# Patient Record
Sex: Female | Born: 1950 | Race: White | Hispanic: No | Marital: Married | Smoking: Never smoker
Health system: Southern US, Community
[De-identification: ages and names within clinical notes are randomized; demographics above are authoritative.]

## PROBLEM LIST (undated history)

## (undated) DIAGNOSIS — E039 Hypothyroidism, unspecified: Secondary | ICD-10-CM

## (undated) DIAGNOSIS — Z923 Personal history of irradiation: Secondary | ICD-10-CM

## (undated) DIAGNOSIS — M8580 Other specified disorders of bone density and structure, unspecified site: Secondary | ICD-10-CM

## (undated) DIAGNOSIS — M1990 Unspecified osteoarthritis, unspecified site: Secondary | ICD-10-CM

## (undated) DIAGNOSIS — E210 Primary hyperparathyroidism: Secondary | ICD-10-CM

## (undated) DIAGNOSIS — C50919 Malignant neoplasm of unspecified site of unspecified female breast: Secondary | ICD-10-CM

## (undated) DIAGNOSIS — C4360 Malignant melanoma of unspecified upper limb, including shoulder: Secondary | ICD-10-CM

## (undated) DIAGNOSIS — C50911 Malignant neoplasm of unspecified site of right female breast: Secondary | ICD-10-CM

## (undated) DIAGNOSIS — D126 Benign neoplasm of colon, unspecified: Secondary | ICD-10-CM

## (undated) HISTORY — DX: Hypothyroidism, unspecified: E03.9

## (undated) HISTORY — PX: ABDOMINAL HYSTERECTOMY: SHX81

## (undated) HISTORY — PX: TISSUE EXPANDER  REMOVAL W/ REPLACEMENT OF IMPLANT: SHX2529

## (undated) HISTORY — DX: Benign neoplasm of colon, unspecified: D12.6

## (undated) HISTORY — DX: Malignant neoplasm of unspecified site of unspecified female breast: C50.919

## (undated) HISTORY — PX: LAPAROSCOPIC TOTAL HYSTERECTOMY: SUR800

## (undated) HISTORY — DX: Malignant melanoma of unspecified upper limb, including shoulder: C43.60

## (undated) HISTORY — DX: Other specified disorders of bone density and structure, unspecified site: M85.80

## (undated) HISTORY — PX: BREAST LUMPECTOMY: SHX2

## (undated) HISTORY — DX: Malignant neoplasm of unspecified site of right female breast: C50.911

## (undated) HISTORY — PX: OTHER SURGICAL HISTORY: SHX169

## (undated) HISTORY — PX: COLONOSCOPY: SHX174

---

## 1998-01-17 ENCOUNTER — Other Ambulatory Visit: Admission: RE | Admit: 1998-01-17 | Discharge: 1998-01-17 | Payer: Self-pay | Admitting: Obstetrics & Gynecology

## 1999-01-09 ENCOUNTER — Other Ambulatory Visit: Admission: RE | Admit: 1999-01-09 | Discharge: 1999-01-09 | Payer: Self-pay | Admitting: Obstetrics & Gynecology

## 2000-01-27 ENCOUNTER — Ambulatory Visit (HOSPITAL_COMMUNITY): Admission: RE | Admit: 2000-01-27 | Discharge: 2000-01-27 | Payer: Self-pay | Admitting: *Deleted

## 2000-01-27 ENCOUNTER — Encounter: Payer: Self-pay | Admitting: *Deleted

## 2000-02-12 ENCOUNTER — Ambulatory Visit (HOSPITAL_COMMUNITY): Admission: RE | Admit: 2000-02-12 | Discharge: 2000-02-12 | Payer: Self-pay | Admitting: *Deleted

## 2000-02-12 ENCOUNTER — Encounter: Payer: Self-pay | Admitting: *Deleted

## 2000-02-15 ENCOUNTER — Other Ambulatory Visit: Admission: RE | Admit: 2000-02-15 | Discharge: 2000-02-15 | Payer: Self-pay | Admitting: Obstetrics & Gynecology

## 2001-02-17 ENCOUNTER — Encounter: Payer: Self-pay | Admitting: Family Medicine

## 2001-02-17 ENCOUNTER — Ambulatory Visit (HOSPITAL_COMMUNITY): Admission: RE | Admit: 2001-02-17 | Discharge: 2001-02-17 | Payer: Self-pay | Admitting: Family Medicine

## 2001-03-10 ENCOUNTER — Other Ambulatory Visit: Admission: RE | Admit: 2001-03-10 | Discharge: 2001-03-10 | Payer: Self-pay | Admitting: Obstetrics & Gynecology

## 2001-10-24 ENCOUNTER — Ambulatory Visit (HOSPITAL_COMMUNITY): Admission: RE | Admit: 2001-10-24 | Discharge: 2001-10-24 | Payer: Self-pay | Admitting: Family Medicine

## 2001-10-24 ENCOUNTER — Encounter: Payer: Self-pay | Admitting: Family Medicine

## 2002-02-16 ENCOUNTER — Encounter: Payer: Self-pay | Admitting: Family Medicine

## 2002-02-16 ENCOUNTER — Ambulatory Visit (HOSPITAL_COMMUNITY): Admission: RE | Admit: 2002-02-16 | Discharge: 2002-02-16 | Payer: Self-pay | Admitting: Family Medicine

## 2002-03-21 ENCOUNTER — Other Ambulatory Visit: Admission: RE | Admit: 2002-03-21 | Discharge: 2002-03-21 | Payer: Self-pay | Admitting: Obstetrics & Gynecology

## 2002-07-19 ENCOUNTER — Encounter (HOSPITAL_COMMUNITY): Admission: RE | Admit: 2002-07-19 | Discharge: 2002-08-18 | Payer: Self-pay | Admitting: Rheumatology

## 2003-03-15 ENCOUNTER — Other Ambulatory Visit: Admission: RE | Admit: 2003-03-15 | Discharge: 2003-03-15 | Payer: Self-pay | Admitting: Obstetrics & Gynecology

## 2003-07-26 ENCOUNTER — Ambulatory Visit (HOSPITAL_COMMUNITY): Admission: RE | Admit: 2003-07-26 | Discharge: 2003-07-26 | Payer: Self-pay | Admitting: Family Medicine

## 2004-04-10 ENCOUNTER — Other Ambulatory Visit: Admission: RE | Admit: 2004-04-10 | Discharge: 2004-04-10 | Payer: Self-pay | Admitting: Obstetrics & Gynecology

## 2005-04-23 ENCOUNTER — Other Ambulatory Visit: Admission: RE | Admit: 2005-04-23 | Discharge: 2005-04-23 | Payer: Self-pay | Admitting: Obstetrics & Gynecology

## 2006-03-11 ENCOUNTER — Ambulatory Visit: Payer: Self-pay | Admitting: Gastroenterology

## 2006-03-24 ENCOUNTER — Ambulatory Visit: Payer: Self-pay | Admitting: Gastroenterology

## 2006-03-24 ENCOUNTER — Encounter (INDEPENDENT_AMBULATORY_CARE_PROVIDER_SITE_OTHER): Payer: Self-pay | Admitting: *Deleted

## 2008-06-28 DIAGNOSIS — Z923 Personal history of irradiation: Secondary | ICD-10-CM

## 2008-06-28 DIAGNOSIS — C50919 Malignant neoplasm of unspecified site of unspecified female breast: Secondary | ICD-10-CM

## 2008-06-28 HISTORY — DX: Personal history of irradiation: Z92.3

## 2008-06-28 HISTORY — PX: BREAST BIOPSY: SHX20

## 2008-06-28 HISTORY — DX: Malignant neoplasm of unspecified site of unspecified female breast: C50.919

## 2008-11-13 ENCOUNTER — Encounter (INDEPENDENT_AMBULATORY_CARE_PROVIDER_SITE_OTHER): Payer: Self-pay | Admitting: Diagnostic Radiology

## 2008-11-13 ENCOUNTER — Encounter (INDEPENDENT_AMBULATORY_CARE_PROVIDER_SITE_OTHER): Payer: Self-pay | Admitting: Obstetrics & Gynecology

## 2008-11-13 ENCOUNTER — Encounter: Admission: RE | Admit: 2008-11-13 | Discharge: 2008-11-13 | Payer: Self-pay | Admitting: Obstetrics & Gynecology

## 2008-11-20 ENCOUNTER — Encounter: Admission: RE | Admit: 2008-11-20 | Discharge: 2008-11-20 | Payer: Self-pay | Admitting: Obstetrics & Gynecology

## 2008-11-20 ENCOUNTER — Ambulatory Visit: Payer: Self-pay | Admitting: Oncology

## 2008-11-26 ENCOUNTER — Ambulatory Visit: Admission: RE | Admit: 2008-11-26 | Discharge: 2008-12-25 | Payer: Self-pay | Admitting: Radiation Oncology

## 2008-12-10 ENCOUNTER — Encounter: Admission: RE | Admit: 2008-12-10 | Discharge: 2008-12-10 | Payer: Self-pay | Admitting: General Surgery

## 2008-12-10 ENCOUNTER — Ambulatory Visit (HOSPITAL_COMMUNITY): Admission: RE | Admit: 2008-12-10 | Discharge: 2008-12-10 | Payer: Self-pay | Admitting: General Surgery

## 2008-12-10 ENCOUNTER — Encounter (INDEPENDENT_AMBULATORY_CARE_PROVIDER_SITE_OTHER): Payer: Self-pay | Admitting: General Surgery

## 2008-12-26 ENCOUNTER — Ambulatory Visit (HOSPITAL_COMMUNITY): Admission: RE | Admit: 2008-12-26 | Discharge: 2008-12-26 | Payer: Self-pay | Admitting: General Surgery

## 2008-12-26 ENCOUNTER — Encounter (INDEPENDENT_AMBULATORY_CARE_PROVIDER_SITE_OTHER): Payer: Self-pay | Admitting: General Surgery

## 2008-12-26 ENCOUNTER — Ambulatory Visit: Admission: RE | Admit: 2008-12-26 | Discharge: 2009-03-21 | Payer: Self-pay | Admitting: Radiation Oncology

## 2008-12-27 ENCOUNTER — Ambulatory Visit (HOSPITAL_COMMUNITY): Payer: Self-pay | Admitting: Oncology

## 2009-02-06 ENCOUNTER — Ambulatory Visit (HOSPITAL_COMMUNITY): Admission: RE | Admit: 2009-02-06 | Discharge: 2009-02-06 | Payer: Self-pay | Admitting: Oncology

## 2009-06-17 ENCOUNTER — Encounter: Admission: RE | Admit: 2009-06-17 | Discharge: 2009-06-17 | Payer: Self-pay | Admitting: Radiation Oncology

## 2009-07-18 ENCOUNTER — Ambulatory Visit (HOSPITAL_COMMUNITY): Payer: Self-pay | Admitting: Oncology

## 2009-08-08 ENCOUNTER — Ambulatory Visit (HOSPITAL_BASED_OUTPATIENT_CLINIC_OR_DEPARTMENT_OTHER): Admission: RE | Admit: 2009-08-08 | Discharge: 2009-08-08 | Payer: Self-pay | Admitting: Orthopedic Surgery

## 2009-11-04 ENCOUNTER — Ambulatory Visit (HOSPITAL_BASED_OUTPATIENT_CLINIC_OR_DEPARTMENT_OTHER): Admission: RE | Admit: 2009-11-04 | Discharge: 2009-11-04 | Payer: Self-pay | Admitting: Orthopedic Surgery

## 2009-11-21 ENCOUNTER — Encounter: Admission: RE | Admit: 2009-11-21 | Discharge: 2009-11-21 | Payer: Self-pay | Admitting: Obstetrics & Gynecology

## 2010-02-13 ENCOUNTER — Ambulatory Visit (HOSPITAL_COMMUNITY): Payer: Self-pay | Admitting: Oncology

## 2010-08-14 ENCOUNTER — Other Ambulatory Visit (HOSPITAL_COMMUNITY): Payer: Self-pay | Admitting: Oncology

## 2010-08-14 ENCOUNTER — Ambulatory Visit (HOSPITAL_COMMUNITY): Payer: BC Managed Care – PPO

## 2010-08-14 DIAGNOSIS — C50919 Malignant neoplasm of unspecified site of unspecified female breast: Secondary | ICD-10-CM

## 2010-09-03 ENCOUNTER — Other Ambulatory Visit: Payer: Self-pay | Admitting: Obstetrics & Gynecology

## 2010-09-03 DIAGNOSIS — Z9889 Other specified postprocedural states: Secondary | ICD-10-CM

## 2010-09-15 LAB — POCT HEMOGLOBIN-HEMACUE: Hemoglobin: 13.9 g/dL (ref 12.0–15.0)

## 2010-10-05 LAB — COMPREHENSIVE METABOLIC PANEL
AST: 31 U/L (ref 0–37)
Albumin: 4.3 g/dL (ref 3.5–5.2)
Alkaline Phosphatase: 63 U/L (ref 39–117)
CO2: 27 mEq/L (ref 19–32)
Chloride: 106 mEq/L (ref 96–112)
Creatinine, Ser: 0.64 mg/dL (ref 0.4–1.2)
GFR calc Af Amer: >60 mL/min (ref >60)
GFR calc non Af Amer: >60 mL/min (ref >60)
Potassium: 4.5 mEq/L (ref 3.5–5.1)
Total Bilirubin: 0.5 mg/dL (ref 0.3–1.2)

## 2010-10-05 LAB — DIFFERENTIAL
Basophils Absolute: 0 10*3/uL (ref 0.0–0.1)
Basophils Relative: 1 % (ref 0–1)
Eosinophils Absolute: 0.2 10*3/uL (ref 0.0–0.7)
Eosinophils Relative: 5 % (ref 0–5)
Lymphocytes Relative: 33 % (ref 12–46)
Monocytes Absolute: 0.4 10*3/uL (ref 0.1–1.0)

## 2010-10-05 LAB — CBC
HCT: 41.3 % (ref 36.0–46.0)
MCHC: 35.2 g/dL (ref 30.0–36.0)
MCV: 87.7 fL (ref 78.0–100.0)
Platelets: 297 10*3/uL (ref 150–400)
RBC: 4.45 MIL/uL (ref 3.87–5.11)
RBC: 4.71 MIL/uL (ref 3.87–5.11)
WBC: 6.1 10*3/uL (ref 4.0–10.5)
WBC: 4.9 10*3/uL (ref 4.0–10.5)

## 2010-11-10 NOTE — Op Note (Signed)
Kelsey Andrews, Kelsey Andrews               ACCOUNT NO.:  [id number]   MEDICAL RECORD NO.:  [id number]          PATIENT TYPE:  AMB   LOCATION:  SDS                          FACILITY:  [facility]   PHYSICIAN:  Ollen Gross. Vernell Morgans, M.D. DATE OF BIRTH:  1951-01-20   DATE OF PROCEDURE:  12/10/2008  DATE OF DISCHARGE:  12/10/2008                               OPERATIVE REPORT   PREOPERATIVE DIAGNOSIS:  Right breast cancer.   POSTOPERATIVE DIAGNOSIS:  Right breast cancer.   PROCEDURE:  Right breast needle-localized lumpectomy and sentinel node  biopsy with injection of blue dye.   SURGEON:  Ollen Gross. Vernell Morgans, MD   ANESTHESIA:  General LMA.   PROCEDURE:  After informed consent was obtained, the patient was brought  to the operating room, placed in the supine position on the operating  table.  After adequate induction of general anesthesia, the patient's  right breast, chest and axilla were all prepped with Betadine and draped  in usual sterile manner.  Earlier in the day, the patient had undergone  injection of 1 mCi of technetium sulfur colloid in the subareolar  position.  The patient also undergone placement of a needle localizing  wire.  In the right breast, the wire was entering the breast inferiorly.  The wound about the 7-8 o'clock position.  At this point, 2 mL of  methylene blue and 3 mL of injectable saline were also injected in the  subareolar position and the breast was massaged for several minutes.  Next, a NeoProbe was used to identify a hot spot in the right axilla.  A  small transverse incision was made with 15-blade knife overlying this  hot spot.  This incision was carried down through the skin and  subcutaneous tissue sharply with electrocautery until the axilla was  entered using the NeoProbe to direct the dissection of blunt.  Hemostat  dissection was carried out until a hot lymph node was identified.  I  could not identify any blue dye.  The lymph node was dissected free by  combination of sharp Bovie dissection and then the lymphatics were  clamped with hemostats, divided and ligated with 3-0 Vicryl ties.  Ex  vivo counts on this lymph node were approximately 3000.  Second hot spot  was identified, which I believe was probably just a second portion of  the regional node and this was also removed in a similar fashion.  No  other hot spot or blue dye were identified.  There were no other  palpable lymph nodes.  Touch preps were sent for the sentinel node,  which were negative.  At this point, area was hemostatic.  The deep  layer of the wound was closed with interrupted 3-0 Vicryl stitches and  the skin was closed with a running 4-0 Monocryl subcuticular stitch.  Next, attention was turned to the right breast.  A radial-type incision  was made approximately the 7 o'clock position of the right breast  overlying the path of the wire.  This incision was made with 15-blade  knife.  This incision was carried through the skin and  subcutaneous  tissue sharply with electrocautery.  Once into the breast tissue, the  path of the wire could be palpated.  A circular portion of breast tissue  was excised sharply around the path of the wire.  This was all done  sharply with electrocautery.  Once this was removed, a specimen  radiograph was obtained that showed the clip to be in the center of the  specimen.  It was then sent to pathology for further evaluation.  Hemostasis was achieved using Bovie electrocautery.  Wound was irrigated  with copious amounts of saline.  The wound was infiltrated with 0.25%  Marcaine.  Some subcutaneous skin flaps were created circumferentially  around the wound.  This allowed [place] to close some of the breast tissue  over the cavity with interrupted 3-0 Vicryl stitches and then we closed  the skin with a running 4-0 Monocryl subcuticular stitch.  Dermabond dressings were applied.  The patient tolerated the procedure  well.  At the end of the case,  all needle, sponge, and instrument counts  were correct.  The patient was then awakened and taken to recovery room  in stable condition.      Ollen Gross. Vernell Morgans, M.D.  Electronically Signed     PST/MEDQ  D:  12/13/2008  T:  12/14/2008  Job:  [id number]

## 2010-11-10 NOTE — Op Note (Signed)
Kelsey Andrews, MCCORMICK               ACCOUNT NO.:  [id number]   MEDICAL RECORD NO.:  [id number]          PATIENT TYPE:  REC   LOCATION:  RDNC                         FACILITY:  [facility]   PHYSICIAN:  Ollen Gross. Vernell Morgans, M.D. DATE OF BIRTH:  06/02/1951   DATE OF PROCEDURE:  12/26/2008  DATE OF DISCHARGE:                               OPERATIVE REPORT   PREOPERATIVE DIAGNOSIS:  Right breast cancer.   POSTOPERATIVE DIAGNOSIS:  Right breast cancer with positive posterior  margin.   PROCEDURE:  Reexcision of right lumpectomy, posterior margin.   SURGEON:  Ollen Gross. Vernell Morgans, MD   ANESTHESIA:  General.   PROCEDURE IN DETAIL:  After informed consent was obtained, the patient  was brought to the operating room and placed in the supine position on  the operating table.  After adequate induction of general anesthesia,  the patient's right breast was prepped with Betadine and draped in the  usual sterile manner.  The old incision was opened with a #15 blade  knife.  The seroma cavity was evacuated.  The deep portion of the cavity  was then reexcised down to the muscular wall.  This was all done sharply  with the electrocautery.  Once this was removed, it was oriented to with  the short double stitch superior and long double stitch lateral and a  single stitch on the new deep true surgical margin.  This was sent to  Pathology for further evaluation.  Hemostasis was achieved using the  Bovie electrocautery.  The deep layer of the wound was then reclosed  with interrupted 3-0 Vicryl stitches and the skin was closed with  running 4-0 Monocryl subcuticular stitch.  Dermabond dressing was  applied. The patient tolerated the procedure.  At the end of the case,  all needle, sponge, and instrument counts were correct.  The patient was  then awakened, and taken to the recovery room in stable condition.      Ollen Gross. Vernell Morgans, M.D.  Electronically Signed     PST/MEDQ  D:  12/26/2008  T:  12/27/2008   Job:  [id number]

## 2010-11-13 NOTE — Consult Note (Signed)
NAME:  Kelsey Andrews, Kelsey Andrews NO.:  [id number]   MEDICAL RECORD NO.:  [id number]                   PATIENT TYPE:   LOCATION:                                       FACILITY:   PHYSICIAN:  Aundra Dubin, M.D.            DATE OF BIRTH:   DATE OF CONSULTATION:  07/19/2002  DATE OF DISCHARGE:                                   CONSULTATION   CHIEF COMPLAINT:  Numb hands, swollen joints.   Dear Annette Stable:   Thank you for this consultation.  The patient is a 60 year old white female  who has had over the last six months several episodes of individual joints  swelling.  They would be quite painful.  At one point, the left wrist was  swollen, later the left thumb.  At one point, the top of her left foot was  swollen and was huge.  This took about two weeks to resolve.  She says she  could hardly walk on it.  Each of these joint areas swelled at separate  times and would resolve before another joint would become involved.  She has  not had any swelling for about 2-1/2 months.   Another problem is waking up with numbness in her hands.  She might have  numbness at other times but usually it has been worse in the mornings.  This  has not occurred for the last week.  When she would wake up, her hands would  be numb.  She would move them about and in 5-10 minutes this would be  resolved.  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  She has also had a  tendonitis or bursitis injected to the right shoulder about one year ago.  This is not bothering her.   At the present time, she is feeling well and is not hurting anywhere.  She  denies any fever or significant rashes.  Her weight is increased over a  number of months and she has recently lost 6 pounds by cutting back on  kilocalories.  Her energy level is described as being low.  She is back on  her hormone therapy and is sleeping well.  There have been no headaches,  diarrhea, constipation, blood or mucus, chest pain,  or shortness of breath.   PAST MEDICAL/SURGICAL HISTORY:  Hypothyroidism, tonsillectomy, hysterectomy.   MEDICATIONS:  1. Lexapro 10 mg daily.  2. Levoxyl 0.75 mg daily.  3. Vivelle 0.1 mg patch.   DRUG INTOLERANCES:  1. PENICILLIN.  2. NARCOTICS.   FAMILY HISTORY:  Her father is 69 years of age and has diabetes and  significant peripheral neuropathy.  Her mother is 45 years of age and has  some mild arthritis and back pain.  She also has HTN.   SOCIAL HISTORY:  She has two children and is married.  She works in a Publishing rights manager.  She does not smoke or drink alcohol.  PHYSICAL EXAMINATION:  VITAL SIGNS:  Weight 186 pounds, blood pressure  100/64, respirations 16, pulse 60.  GENERAL:  She is slightly overweight and is in no distress.  SKIN:  Clear.  HEENT:  PERRL/EOMI.  Mouth clear.  NECK:  Fullness of the thyroid, nontender.  LUNGS:  Clear.  HEART:  Regular.  No murmur.  ABDOMEN:  Negative HSM, nontender.  MUSCULOSKELETAL:  The hands showed no degenerative nodularity throughout.  Wrists, elbows, and shoulders have a good range of motion and the right  shoulder is mildly stiff.  Trigger points at the shoulders, neck, occiput,  anterior chest, and upper paraspinous muscles are nontender.  Hips, knees,  ankles, and feet have a good range of motion and are all cool and nonswollen  and nontender.  NEUROLOGIC:  Nonfocal.   ASSESSMENT AND PLAN:  1. Episodic joint swelling.  No full diagnosis could be made at this time.     She would be quite young to have regular gout but this is certainly in     the differential.  The swollen joints, however, were not red and warm.     Another possibility is she could have gout-like episodes from basic     hydroxy calcium shedding into joints; however, usually these types of     presentations the patient has decided osteoarthritis.  She does not have     this.  We will have to follow her course to see if it returns.  I will     want to see her  if she has swollen joints.  2. Hand numbness.  I believe this is positional and is related to lack of     blood flow into the joints causing the numbness and tingling.  This     resolves with movement.  3. History of right shoulder bursitis.  4. Hypothyroidism.   Bill, at the present time the patient is pain free and doing well.  I have  greatly encouraged her to return for further evaluation if the swelling and  pain returns to any of the joints.  She will return on a p.r.n. basis.  Thank you.                                               Aundra Dubin, M.D.    WWT/MEDQ  D:  07/19/2002  T:  07/20/2002  Job:  [id number]   cc:   Kirk Ruths, M.D.  [address]  [place]  [place] [address]  Fax: [phone]

## 2010-11-24 ENCOUNTER — Ambulatory Visit
Admission: RE | Admit: 2010-11-24 | Discharge: 2010-11-24 | Disposition: A | Discharge status: Home / Self Care | Payer: BC Managed Care – PPO | Source: Ambulatory Visit | Attending: Obstetrics & Gynecology | Admitting: Obstetrics & Gynecology

## 2010-11-24 DIAGNOSIS — Z9889 Other specified postprocedural states: Secondary | ICD-10-CM

## 2010-12-22 ENCOUNTER — Other Ambulatory Visit (INDEPENDENT_AMBULATORY_CARE_PROVIDER_SITE_OTHER): Payer: Self-pay | Admitting: General Surgery

## 2010-12-22 DIAGNOSIS — Z9889 Other specified postprocedural states: Secondary | ICD-10-CM

## 2011-01-01 ENCOUNTER — Ambulatory Visit
Admission: RE | Admit: 2011-01-01 | Discharge: 2011-01-01 | Disposition: A | Discharge status: Home / Self Care | Payer: BC Managed Care – PPO | Source: Ambulatory Visit | Attending: General Surgery | Admitting: General Surgery

## 2011-01-01 DIAGNOSIS — Z9889 Other specified postprocedural states: Secondary | ICD-10-CM

## 2011-01-01 MED ORDER — GADOBENATE DIMEGLUMINE 529 MG/ML IV SOLN
18.0000 mL | Freq: Once | INTRAVENOUS | Status: AC | PRN
  Administered 2011-01-01: 18 mL via INTRAVENOUS

## 2011-01-07 ENCOUNTER — Telehealth (INDEPENDENT_AMBULATORY_CARE_PROVIDER_SITE_OTHER): Payer: Self-pay | Admitting: General Surgery

## 2011-01-08 ENCOUNTER — Telehealth (INDEPENDENT_AMBULATORY_CARE_PROVIDER_SITE_OTHER): Payer: Self-pay | Admitting: General Surgery

## 2011-01-08 NOTE — Telephone Encounter (Signed)
Reviewed mri results

## 2011-02-05 ENCOUNTER — Ambulatory Visit (HOSPITAL_COMMUNITY)
Admission: RE | Admit: 2011-02-05 | Discharge: 2011-02-05 | Disposition: A | Discharge status: Home / Self Care | Payer: BC Managed Care – PPO | Source: Ambulatory Visit | Attending: Oncology | Admitting: Oncology

## 2011-02-05 DIAGNOSIS — Z780 Asymptomatic menopausal state: Secondary | ICD-10-CM | POA: Insufficient documentation

## 2011-02-05 DIAGNOSIS — M899 Disorder of bone, unspecified: Secondary | ICD-10-CM | POA: Insufficient documentation

## 2011-02-05 DIAGNOSIS — C50919 Malignant neoplasm of unspecified site of unspecified female breast: Secondary | ICD-10-CM

## 2011-02-11 ENCOUNTER — Encounter: Payer: Self-pay | Admitting: Internal Medicine

## 2011-02-12 ENCOUNTER — Encounter (HOSPITAL_COMMUNITY): Payer: BC Managed Care – PPO | Attending: Oncology | Admitting: Oncology

## 2011-02-12 ENCOUNTER — Encounter (HOSPITAL_COMMUNITY): Payer: Self-pay | Admitting: Oncology

## 2011-02-12 DIAGNOSIS — Z79899 Other long term (current) drug therapy: Secondary | ICD-10-CM | POA: Insufficient documentation

## 2011-02-12 DIAGNOSIS — M899 Disorder of bone, unspecified: Secondary | ICD-10-CM

## 2011-02-12 DIAGNOSIS — K117 Disturbances of salivary secretion: Secondary | ICD-10-CM

## 2011-02-12 DIAGNOSIS — M8580 Other specified disorders of bone density and structure, unspecified site: Secondary | ICD-10-CM

## 2011-02-12 DIAGNOSIS — M949 Disorder of cartilage, unspecified: Secondary | ICD-10-CM

## 2011-02-12 DIAGNOSIS — N390 Urinary tract infection, site not specified: Secondary | ICD-10-CM

## 2011-02-12 DIAGNOSIS — C50919 Malignant neoplasm of unspecified site of unspecified female breast: Secondary | ICD-10-CM

## 2011-02-12 LAB — COMPREHENSIVE METABOLIC PANEL
ALT: 30 U/L (ref 0–35)
CO2: 26 mEq/L (ref 19–32)
Calcium: 10.6 mg/dL — ABNORMAL HIGH (ref 8.4–10.5)
Chloride: 104 mEq/L (ref 96–112)
Creatinine, Ser: 0.6 mg/dL (ref 0.50–1.10)
GFR calc Af Amer: >60 mL/min (ref >60)
GFR calc non Af Amer: >60 mL/min (ref >60)
Glucose, Bld: 84 mg/dL (ref 70–99)
Total Bilirubin: 0.3 mg/dL (ref 0.3–1.2)

## 2011-02-12 LAB — URINALYSIS, ROUTINE W REFLEX MICROSCOPIC
Bilirubin Urine: NEGATIVE
Hgb urine dipstick: NEGATIVE
Nitrite: NEGATIVE
Protein, ur: NEGATIVE mg/dL
Specific Gravity, Urine: <1.005 — ABNORMAL LOW (ref 1.005–1.030)
Urobilinogen, UA: 0.2 mg/dL (ref 0.0–1.0)

## 2011-02-12 LAB — URINE MICROSCOPIC-ADD ON

## 2011-02-12 LAB — CBC
Hemoglobin: 14.2 g/dL (ref 12.0–15.0)
MCH: 29.9 pg (ref 26.0–34.0)
MCV: 87.2 fL (ref 78.0–100.0)
RBC: 4.75 MIL/uL (ref 3.87–5.11)
WBC: 5.7 10*3/uL (ref 4.0–10.5)

## 2011-02-12 NOTE — Patient Instructions (Signed)
[facility]  Discharge Instructions  RECOMMENDATIONS MADE BY THE CONSULTANT AND ANY TEST RESULTS WILL BE SENT TO YOUR REFERRING DOCTOR.   EXAM FINDINGS BY MD TODAY AND SIGNS AND SYMPTOMS TO REPORT TO CLINIC OR PRIMARY MD:  Exam good today.  INSTRUCTIONS GIVEN AND DISCUSSED: Other  : You may need to se your rheumatologist again  SPECIAL INSTRUCTIONS/FOLLOW-UP: Other (Referral/Appointments)  Continue calcium daily and repeat bone density in two years Reeturn to [place] in 6 months   I acknowledge that I have been informed and understand all the instructions given to me and received a copy. I do not have any more questions at this time, but understand that I may call the Specialty Clinic at [facility] at [phone] during business hours should I have any further questions or need assistance in obtaining follow-up care.    __________________________________________  _____________  __________ Signature of Patient or Authorized Representative            Date                   Time    __________________________________________ Nurse's Signature

## 2011-02-12 NOTE — Progress Notes (Signed)
This office note has been dictated.

## 2011-02-13 NOTE — Progress Notes (Signed)
Dr. Chevis Pretty is her doctor, Dr. [name] and Lurline Hare of radiation therapy department, [facility].  DIAGNOSES: 1. 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  She had re-excision on     12/25/2008.  No further invasive disease was found.  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  It was 100% ER, PR-     positive.  Her invasive disease was ER positive 100%, PR negative     however.  Her Ki-67 marker was 15%.  HER-2/neu was negative.     Oncotype score showed her to be ER positive, weakly PR positive,     HER-2-negative, and a recurrence score of 21, indicating recurrence     rate around 13% in 10 years after therapy with tamoxifen.  We     elected, therefore, to give her radiation therapy and antiestrogen     therapy in the form of Arimidex, which she is tolerating well. 2. Dry eye syndrome, dry mouth.  Rule out Sjogren's. 3. Bilateral carpal tunnel surgery. 4. Mild obesity with reflux symptomatology in the past. 5. 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  She has started noticing the last 6 months or so more dry mouth than she has ever had before.  She has dry eye syndrome and stated that she saw Dr. Jimmy Footman in [place] years ago for possible Sjogren's, but it was ruled out.  This, however, is worsening, namely the dry mouth syndrome and trouble swallowing.  She cannot swallow food without a gulp of water, it sounds like, with every bite.  She does not have other symptoms suggestive of Sjogren's that I can appreciate at this time.  From the standpoint of breast cancer, she is not aware of any lumps over the breast.  The scar she states is getting a little thicker but it is slightly tender at best.  She has kept up with her mammography and MRI as of 01/01/2011.  The mammography was on  11/04/2010.  The MRI showed no evidence for abnormalities other than post-lumpectomy changes.  Her labs were done on 17th of this month, namely today, and calcium is minimally elevated at 10.6, interestingly.  Alkaline phosphatase 127. CBC:  Platelets are unremarkable.  Sugar is 84.  Urinalysis showed 7-10 white cells but otherwise negative.  She has a small number of leukocytes, which led to the microscopic showed 7-10 white cells.  She has had, she states, 2, possibly 3 urinary tract infections since starting the Arimidex but, again, there is no evidence of one at this time.  I am wondering whether her estrogen deficiency at this time because of the Arimidex is leading to some urinary symptomatology.  PHYSICAL EXAMINATION:  Vital signs today:  She is still overweight for her height.  That is not new or different.  Her weight is 195 pounds today, up from 193 in February, up from 187 when I first met her.  Her blood pressure is 118/82, left arm, sitting position.  Pulse right around 120 initially.  When I saw her, it was down to 92 and regular. She is afebrile.  General:  Warm and dry to the touch.  She is in no acute distress.  Lymphatic:  She has no adenopathy.  Breasts:  The right breast is slightly smaller and has surgical changes,  slightly thickened but without mass.  The scar, I think, is very normal.  The left breast is negative for masses.  Heart: A regular rhythm and rate without murmur, rub or gallop.  Lungs:  Clear to auscultation and percussion. There is no thyromegaly.  She has no lymphadenopathy in the cervical, supraclavicular, infraclavicular, axillary or inguinal areas.  Abdomen: Soft, obese, nontender, without organomegaly or masses.  HEENT:  Mouth looks a very dry, however, on exam.  I do think she needs to be reevaluated possibly since this dry mouth is getting much, much worse, and I have recommended that she call Dr. Jeanine Luz office to see if he is still  practicing or whether his counterpart can see her.  In the meantime I think she needs to continue the Arimidex and I think she does need to take her calcium on a daily basis.  She misses about half of her month's doses of calcium.  She is only taking 1 pill a day.  Her recent bone density report did show changes compared to 2010.  She is taking her vitamin D religiously at 2000 units a day.  We will see her back in 6 months.She is to let me know sooner if she needs [place], but I did ask her to talk to Dr. [name] if she gets worse.  She is due to see him in December but if she needs to, I have encouraged her to see him sooner but I am happy to talk to him if she needs me to.    ______________________________ Ladona Horns. Mariel Sleet, MD ESN/MEDQ  D:  02/12/2011  T:  02/13/2011  Job:  [id number]

## 2011-02-14 LAB — URINE CULTURE: Colony Count: 7000

## 2011-02-15 ENCOUNTER — Telehealth (HOSPITAL_COMMUNITY): Payer: Self-pay | Admitting: *Deleted

## 2011-02-15 LAB — URINE CULTURE: Culture  Setup Time: 201208172010

## 2011-02-15 NOTE — Telephone Encounter (Signed)
Spoke with pt. Bactrim DS called into [place]. Pt request that Dr.Neijstrom recommend a urologist in [place]. She thinks that her husband see's Dr.Peterson in [place].

## 2011-02-15 NOTE — Telephone Encounter (Signed)
Message copied by Dennie Maizes on Mon Feb 15, 2011  4:23 PM ------      Message from: Mariel Sleet, ERIC S      Created: Mon Feb 15, 2011  3:30 PM       Call in Bactrim ds Bid for 8 days and schedule Urology consult for repeated UTIs since being on Arimidex and tell her I talked to Dr [name]

## 2011-02-16 NOTE — Telephone Encounter (Signed)
I spoke with Kelsey Andrews and she is ok with seeing [facility] here instead of [place]-so make app't and call her mobile.  I just spoke with her.

## 2011-03-02 ENCOUNTER — Ambulatory Visit (INDEPENDENT_AMBULATORY_CARE_PROVIDER_SITE_OTHER): Payer: BC Managed Care – PPO | Admitting: Urology

## 2011-03-02 DIAGNOSIS — N3020 Other chronic cystitis without hematuria: Secondary | ICD-10-CM

## 2011-03-02 DIAGNOSIS — N952 Postmenopausal atrophic vaginitis: Secondary | ICD-10-CM

## 2011-03-05 ENCOUNTER — Encounter: Payer: Self-pay | Admitting: Gastroenterology

## 2011-03-05 ENCOUNTER — Ambulatory Visit (AMBULATORY_SURGERY_CENTER): Payer: BC Managed Care – PPO | Admitting: *Deleted

## 2011-03-05 VITALS — Ht 65.5 in | Wt 197.0 lb

## 2011-03-05 DIAGNOSIS — Z1211 Encounter for screening for malignant neoplasm of colon: Secondary | ICD-10-CM

## 2011-03-05 MED ORDER — SUPREP BOWEL PREP KIT 17.5-3.13-1.6 GM/177ML PO SOLN: 1.0000 | ORAL | Status: DC

## 2011-03-26 ENCOUNTER — Ambulatory Visit (AMBULATORY_SURGERY_CENTER): Payer: BC Managed Care – PPO | Admitting: Internal Medicine

## 2011-03-26 ENCOUNTER — Encounter: Payer: Self-pay | Admitting: Internal Medicine

## 2011-03-26 VITALS — Temp 98.9°F | Ht 65.0 in | Wt 197.0 lb

## 2011-03-26 DIAGNOSIS — D126 Benign neoplasm of colon, unspecified: Secondary | ICD-10-CM

## 2011-03-26 DIAGNOSIS — Z1211 Encounter for screening for malignant neoplasm of colon: Secondary | ICD-10-CM

## 2011-03-26 MED ORDER — SODIUM CHLORIDE 0.9 % IV SOLN: 500.0000 mL | INTRAVENOUS | Status: DC

## 2011-03-26 NOTE — Patient Instructions (Signed)
Handouts given on polyps, diverticulosis, high fiber diet  Discharge instructions given per blue and green sheets  Repeat colonoscopy in 7 years- 2019- we will mail you a letter to remind you of this  You will receive a letter from [place] in 1-2 weeks with the pathology results and Dr Regino Schultze recommendations

## 2011-03-29 ENCOUNTER — Telehealth: Payer: Self-pay

## 2011-03-29 NOTE — Telephone Encounter (Signed)
No ID on answering machine. 

## 2011-03-30 ENCOUNTER — Encounter: Payer: Self-pay | Admitting: Internal Medicine

## 2011-06-08 ENCOUNTER — Ambulatory Visit (INDEPENDENT_AMBULATORY_CARE_PROVIDER_SITE_OTHER): Payer: BC Managed Care – PPO | Admitting: Urology

## 2011-06-08 DIAGNOSIS — N3020 Other chronic cystitis without hematuria: Secondary | ICD-10-CM

## 2011-06-08 DIAGNOSIS — N952 Postmenopausal atrophic vaginitis: Secondary | ICD-10-CM

## 2011-08-13 ENCOUNTER — Encounter (HOSPITAL_COMMUNITY): Payer: BC Managed Care – PPO | Attending: Oncology | Admitting: Oncology

## 2011-08-13 VITALS — BP 132/79 | HR 70 | Temp 98.0°F | Wt 195.7 lb

## 2011-08-13 DIAGNOSIS — M899 Disorder of bone, unspecified: Secondary | ICD-10-CM

## 2011-08-13 DIAGNOSIS — M949 Disorder of cartilage, unspecified: Secondary | ICD-10-CM

## 2011-08-13 DIAGNOSIS — D0590 Unspecified type of carcinoma in situ of unspecified breast: Secondary | ICD-10-CM

## 2011-08-13 DIAGNOSIS — H04129 Dry eye syndrome of unspecified lacrimal gland: Secondary | ICD-10-CM

## 2011-08-13 DIAGNOSIS — C50911 Malignant neoplasm of unspecified site of right female breast: Secondary | ICD-10-CM

## 2011-08-13 NOTE — Progress Notes (Signed)
CC:   Kelsey Andrews. Kelsey Sills, MD Kelsey Millard. Andrews, M.D. Kelsey Andrews. Kelsey Andrews, M.D. Kelsey Andrews, M.D.  DIAGNOSES: 1. Stage I (T1b N0) intermediate grade invasive ductal carcinoma of     the right breast with biopsy on 11/13/2008, re-excision on     12/25/2008 with no further invasive disease found.  She did have     some associated ductal carcinoma in situ close to the posterior     margin and a little skeletal muscle had to be resected for clear     margins.  Her invasive disease was ER positive at 100%, but PR     negative.  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  Ki-67     marker for invasive disease was 15%, HER-2/neu was negative, and an     Oncotype score showed her to be ER positive and weakly PR positive,     but HER-2 negative, and a recurrence score was 21, indicating a     recurrence rate around 13% at 10 years after therapy with     tamoxifen.  So we gave her postoperative radiation therapy and then     started her on Arimidex, which she is tolerating very well except     for maybe a little joint aching which may or may not have been     there before.  It certainly does not slow her down at all and she     does not have to be treated for it. 2. Dry eye syndrome with associated dry mouth, but she has not been     back to see her rheumatologist. 3. Bilateral carpal tunnel surgery. 4. Obesity with reflux symptomatology in the past. 5. Osteopenia and I do want her to be one 600-mg calcium pill a day,     either in a multivitamin form, but along with her vitamin D as well     1000 to 2000 units just because the Arimidex is so osteopenia     driven. 6. Colonoscopy in September 2012 by Dr. Lina Andrews with a benign     polyp found. Kelsey Andrews is working full-time, doing very, very well.  Just a little bit of aching that I mentioned above is all that she has.  It is usually in her hips after she "walks a lot."  So it is not present when she wakes up or is just sitting still.  She  is not having anything new or different otherwise.  PHYSICAL EXAMINATION:  Vital Signs:  Stable vital signs.  Weight is still too high at 195 pounds, blood pressure 132/79, right arm, sitting position, pulse 72 and regular, respirations 16 and unlabored, and she is afebrile.  Lymph:  Her lymph nodes are negative throughout.  Breasts: The right breast still has a little thickening near the scar, inferior portion of the breast right around the 6:30 position.  It extends for 1 cm or so on each side of the scar, but is not different.  The rest of the breast is negative for any masses.  The left breast is negative for masses.  Heart:  Shows a regular rhythm and rate without murmur, rub, or gallop.  Abdomen:  Soft and nontender without organomegaly.  Bowel sounds are normal.  Extremities:  She has no peripheral edema.  Lungs: Clear.  It is of note that she had 2 sentinel nodes, both of which were negative as well.  Her tumor was 7 mm in greatest dimension as  far as the invasive tumor was concerned.  No LVI was seen either.  So we will continue her on the Arimidex and follow up every 6 months.  She had recommendation by Dr. Juanda Andrews to just have a colonoscopy in 7 years.  We will see her in 6 months, sooner if need be.  I did not do any blood work on her since she had blood work in December by Dr. Ouida Andrews.    ______________________________ Kelsey Andrews. Kelsey Sleet, MD ESN/MEDQ  D:  08/13/2011  T:  08/13/2011  Job:  [id number]

## 2011-08-13 NOTE — Progress Notes (Signed)
This office note has been dictated.

## 2011-08-13 NOTE — Patient Instructions (Signed)
Kelsey Andrews  [id number] [birth year]   [facility]  Discharge Instructions  RECOMMENDATIONS MADE BY THE CONSULTANT AND ANY TEST RESULTS WILL BE SENT TO YOUR REFERRING DOCTOR.   EXAM FINDINGS BY MD TODAY AND SIGNS AND SYMPTOMS TO REPORT TO CLINIC OR PRIMARY MD: You are doing well.  Area below right breast is scar tissue. Need to take 600 mg of calcium daily.  MEDICATIONS PRESCRIBED: none   INSTRUCTIONS GIVEN AND DISCUSSED: Other :report any new lumps, bone pain or shortness of breath.  SPECIAL INSTRUCTIONS/FOLLOW-UP: Xray Studies Needed:  Bone Density in 2012/12/01 and Return to Clinic in 6 months.   I acknowledge that I have been informed and understand all the instructions given to me and received a copy. I do not have any more questions at this time, but understand that I may call the Specialty Clinic at [facility] at [phone] during business hours should I have any further questions or need assistance in obtaining follow-up care.    __________________________________________  _____________  __________ Signature of Patient or Authorized Representative            Date                   Time    __________________________________________ Nurse's Signature

## 2011-08-23 ENCOUNTER — Encounter (INDEPENDENT_AMBULATORY_CARE_PROVIDER_SITE_OTHER): Payer: Self-pay | Admitting: General Surgery

## 2011-08-25 ENCOUNTER — Ambulatory Visit (INDEPENDENT_AMBULATORY_CARE_PROVIDER_SITE_OTHER): Payer: BC Managed Care – PPO | Admitting: General Surgery

## 2011-08-25 ENCOUNTER — Encounter (INDEPENDENT_AMBULATORY_CARE_PROVIDER_SITE_OTHER): Payer: Self-pay | Admitting: General Surgery

## 2011-08-25 VITALS — BP 122/84 | HR 84 | Temp 97.4°F | Resp 18 | Ht 65.5 in | Wt 195.2 lb

## 2011-08-25 DIAGNOSIS — C50911 Malignant neoplasm of unspecified site of right female breast: Secondary | ICD-10-CM | POA: Insufficient documentation

## 2011-08-25 DIAGNOSIS — C50919 Malignant neoplasm of unspecified site of unspecified female breast: Secondary | ICD-10-CM

## 2011-08-25 HISTORY — DX: Malignant neoplasm of unspecified site of right female breast: C50.911

## 2011-08-25 NOTE — Patient Instructions (Signed)
Continue regular self exams Continue arimidex 

## 2011-08-25 NOTE — Progress Notes (Signed)
Subjective:     Patient ID: Kelsey Andrews, female   DOB: [birth year], 61 y.o.   MRN: [id number]  HPI The patient is a 61 year old white female who is 2-1/2 years out from a right lumpectomy and negative sentinel node biopsy for a T1 B. N0 right breast cancer. She was strongly ER positive. She is taking Arimidex. She does have a lot of hot flashes and joint aches associated with it. She still has some tenderness at her scar but this has been stable. She has no other new aches or pains. She denies any discharge from the nipple.  Review of Systems  Constitutional: Negative.   HENT: Negative.   Eyes: Negative.   Respiratory: Negative.   Cardiovascular: Negative.   Gastrointestinal: Negative.   Genitourinary: Negative.   Musculoskeletal: Negative.   Skin: Negative.   Neurological: Negative.   Hematological: Negative.   Psychiatric/Behavioral: Negative.        Objective:   Physical Exam  Constitutional: She is oriented to person, place, and time. She appears well-developed and well-nourished.  HENT:  Head: Normocephalic and atraumatic.  Eyes: Conjunctivae and EOM are normal. Pupils are equal, round, and reactive to light.  Neck: Normal range of motion. Neck supple.  Cardiovascular: Normal rate, regular rhythm and normal heart sounds.   Pulmonary/Chest: Effort normal and breath sounds normal.       She has some nodularity beneath the scar. This is stable. She has no other palpable mass in either breast. No axillary supraclavicular or cervical lymphadenopathy.  Abdominal: Soft. Bowel sounds are normal. She exhibits no mass. There is no tenderness.  Musculoskeletal: Normal range of motion.  Neurological: She is alert and oriented to person, place, and time.  Skin: Skin is warm and dry.  Psychiatric: She has a normal mood and affect. Her behavior is normal.       Assessment:     2-1/2 years status post right lumpectomy and negative sentinel node biopsy    Plan:     At this point  she will continue Arimidex. I've encouraged her to continue to do regular self exams. We will plan to see her back in 6 months .

## 2011-10-11 ENCOUNTER — Other Ambulatory Visit: Payer: Self-pay | Admitting: Obstetrics & Gynecology

## 2011-10-11 DIAGNOSIS — Z1231 Encounter for screening mammogram for malignant neoplasm of breast: Secondary | ICD-10-CM

## 2011-11-08 ENCOUNTER — Other Ambulatory Visit (HOSPITAL_COMMUNITY): Payer: Self-pay | Admitting: Oncology

## 2011-11-08 ENCOUNTER — Encounter (HOSPITAL_COMMUNITY): Payer: Self-pay | Admitting: Oncology

## 2011-11-08 DIAGNOSIS — C50919 Malignant neoplasm of unspecified site of unspecified female breast: Secondary | ICD-10-CM

## 2011-11-08 MED ORDER — ANASTROZOLE 1 MG PO TABS: 1.0000 mg | ORAL_TABLET | Freq: Every day | ORAL | Status: DC

## 2011-11-26 ENCOUNTER — Ambulatory Visit
Admission: RE | Admit: 2011-11-26 | Discharge: 2011-11-26 | Disposition: A | Discharge status: Home / Self Care | Payer: BC Managed Care – PPO | Source: Ambulatory Visit | Attending: Obstetrics & Gynecology | Admitting: Obstetrics & Gynecology

## 2011-11-26 DIAGNOSIS — Z1231 Encounter for screening mammogram for malignant neoplasm of breast: Secondary | ICD-10-CM

## 2012-02-11 ENCOUNTER — Encounter (HOSPITAL_COMMUNITY): Payer: BC Managed Care – PPO | Attending: Internal Medicine

## 2012-02-11 VITALS — BP 128/81 | HR 89 | Temp 98.1°F | Resp 18 | Wt 198.4 lb

## 2012-02-11 DIAGNOSIS — Z170 Estrogen receptor positive status [ER+]: Secondary | ICD-10-CM

## 2012-02-11 DIAGNOSIS — D0590 Unspecified type of carcinoma in situ of unspecified breast: Secondary | ICD-10-CM

## 2012-02-11 DIAGNOSIS — C50519 Malignant neoplasm of lower-outer quadrant of unspecified female breast: Secondary | ICD-10-CM

## 2012-02-11 DIAGNOSIS — C50919 Malignant neoplasm of unspecified site of unspecified female breast: Secondary | ICD-10-CM | POA: Insufficient documentation

## 2012-02-11 DIAGNOSIS — Z09 Encounter for follow-up examination after completed treatment for conditions other than malignant neoplasm: Secondary | ICD-10-CM | POA: Insufficient documentation

## 2012-02-11 LAB — COMPREHENSIVE METABOLIC PANEL
ALT: 33 U/L (ref 0–35)
Alkaline Phosphatase: 106 U/L (ref 39–117)
CO2: 26 mEq/L (ref 19–32)
GFR calc Af Amer: >90 mL/min (ref >90)
GFR calc non Af Amer: >90 mL/min (ref >90)
Glucose, Bld: 94 mg/dL (ref 70–99)
Potassium: 4.1 mEq/L (ref 3.5–5.1)
Sodium: 142 mEq/L (ref 135–145)

## 2012-02-11 LAB — CBC WITH DIFFERENTIAL/PLATELET
Lymphocytes Relative: 30 % (ref 12–46)
Lymphs Abs: 1.5 10*3/uL (ref 0.7–4.0)
MCV: 86.3 fL (ref 78.0–100.0)
Neutrophils Relative %: 57 % (ref 43–77)
Platelets: 295 10*3/uL (ref 150–400)
RBC: 4.81 MIL/uL (ref 3.87–5.11)
WBC: 5 10*3/uL (ref 4.0–10.5)

## 2012-02-11 NOTE — Progress Notes (Signed)
[facility] Telephone:([phone]) [phone]   [phone]  OFFICE PROGRESS NOTE  [name], MD 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 Her invasive disease was ER positive at 100%, but PR negative. 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 Ki-67 marker for invasive disease was 15%, HER-2/neu was negative, and an Oncotype score showed her to be ER positive and weakly PR positive, but HER-2 negative, and a recurrence score was 21, indicating a recurrence rate around 13% at 10 years after therapy with tamoxifen. 2) postoperative radiotherapy.  CURRENT THERAPY: Arimidex 1 mg by mouth daily.  INTERVAL HISTORY: Kelsey Andrews 61 y.o. female returns to the clinic today for routine six-month followup visit. The patient is feeling fine today with no specific complaints. She denied having any significant weight loss or night sweats. She denied having any chest pain or shortness breath, no cough or hemoptysis. She denied having any nausea or vomiting or change in her bowel movement. She had a mammogram in April of 2013 that showed no evidence for malignancy.  MEDICAL HISTORY: Past Medical History  Diagnosis Date  . Thyroid activity decreased   . Breast cancer     rt.  . Melanoma of shoulder   . Adenomatous colon polyp     ALLERGIES:  is allergic to morphine and related and penicillins.  MEDICATIONS:  Current Outpatient Prescriptions  Medication Sig Dispense Refill  . anastrozole (ARIMIDEX) 1 MG tablet Take 1 tablet (1 mg total) by mouth daily.  30 tablet  5  . aspirin 81 MG EC tablet Take 81 mg by  mouth daily.        . Cholecalciferol (VITAMIN D) 2000 UNITS tablet Take 2,000 Units by mouth daily.        . citalopram (CELEXA) 10 MG tablet Take 20 mg by mouth daily.      . hydrOXYzine (ATARAX) 10 MG tablet Take 10 mg by mouth 2 (two) times daily as needed.        Marland Kitchen levothyroxine (SYNTHROID, LEVOTHROID) 137 MCG tablet Take 137 mcg by mouth daily.        . Multiple Vitamins-Minerals (MULTIVITAMIN WITH MINERALS) tablet Take 1 tablet by mouth daily.        . Probiotic Product (PROBIOTIC DAILY PO) Take by mouth. Contains calcium 200mg         SURGICAL HISTORY:  Past Surgical History  Procedure Date  . Breast lumpectomy     rt.  . Tissue expander  removal w/ replacement of implant     rt. breast tissue removed  . Laparoscopic total hysterectomy   . Tonscellectomy   . Btl   . Removal of melanoma     Rt shoulder  . Carpel tunnel     both  . Colonoscopy     REVIEW OF SYSTEMS:  A comprehensive review of systems was negative.   PHYSICAL EXAMINATION: General appearance: alert, cooperative and no distress Head: Normocephalic, without obvious abnormality, atraumatic Neck: no adenopathy Lymph nodes: Cervical, supraclavicular, and axillary nodes normal. Breast exam: Showed no pedal or masses and no axillary lymphadenopathy bilaterally. Resp:  clear to auscultation bilaterally Cardio: regular rate and rhythm, S1, S2 normal, no murmur, click, rub or gallop GI: soft, non-tender; bowel sounds normal; no masses,  no organomegaly Extremities: extremities normal, atraumatic, no cyanosis or edema Neurologic: Alert and oriented X 3, normal strength and tone. Normal symmetric reflexes. Normal coordination and gait  ECOG PERFORMANCE STATUS: 0 - Asymptomatic  Blood pressure 128/81, pulse 89, temperature 98.1 F (36.7 C), temperature source Oral, resp. rate 18, weight 198 lb 6.4 oz (89.994 kg).  LABORATORY DATA: Lab Results  Component Value Date   WBC 5.7 02/12/2011   HGB 14.2 02/12/2011   HCT  41.4 02/12/2011   MCV 87.2 02/12/2011   PLT 321 02/12/2011      Chemistry      Component Value Date/Time   NA 141 02/12/2011 1234   K 3.6 02/12/2011 1234   CL 104 02/12/2011 1234   CO2 26 02/12/2011 1234   BUN 13 02/12/2011 1234   CREATININE 0.60 02/12/2011 1234      Component Value Date/Time   CALCIUM 10.6* 02/12/2011 1234   ALKPHOS 127* 02/12/2011 1234   AST 30 02/12/2011 1234   ALT 30 02/12/2011 1234   BILITOT 0.3 02/12/2011 1234       RADIOGRAPHIC STUDIES: No results found.  ASSESSMENT: This is a very pleasant 61 years old white female with stage I (T1 B., N0, M0) intermediate grade invasive ductal carcinoma of the right breast status post excision followed by adjuvant radiotherapy and currently on treatment with Arimidex since 2010 with no evidence for disease recurrence.  PLAN: The patient is doing fine today. I recommended for her to continue on Arimidex with the current dose. I will check a CBC, comprehensive metabolic panel and CEA 27.29 today and in 6 months. She will also have repeat chest x-ray in 6 months before her upcoming visit with Dr. Mariel Sleet. The patient was advised to call me immediately if she has any concerning symptoms in the interval.  All questions were answered. The patient knows to call the clinic with any problems, questions or concerns. We can certainly see the patient much sooner if necessary.

## 2012-02-11 NOTE — Patient Instructions (Signed)
[facility]  Discharge Instructions Kelsey Andrews  DOB 1951-02-13 CSN [id number]  MRN [id number] Dr. Glenford Peers    RECOMMENDATIONS MADE BY THE CONSULTANT AND ANY TEST RESULTS WILL BE SENT TO YOUR REFERRING DOCTOR.   SPECIAL INSTRUCTIONS/FOLLOW-UP: Lab work Needed today:CBC, CMET, CA27.29,  and Return to Clinic in 6 months.     I acknowledge that I have been informed and understand all the instructions given to me and received a copy. I do not have any more questions at this time, but understand that I may call the Specialty Clinic at [facility] at [phone] during business hours should I have any further questions or need assistance in obtaining follow-up care.    __________________________________________  _____________  __________ Signature of Patient or Authorized Representative            Date                   Time    __________________________________________ Nurse's Signature

## 2012-07-14 ENCOUNTER — Encounter (INDEPENDENT_AMBULATORY_CARE_PROVIDER_SITE_OTHER): Payer: Self-pay | Admitting: General Surgery

## 2012-08-09 ENCOUNTER — Encounter (HOSPITAL_COMMUNITY): Payer: BC Managed Care – PPO | Attending: Oncology

## 2012-08-09 ENCOUNTER — Ambulatory Visit (HOSPITAL_COMMUNITY)
Admission: RE | Admit: 2012-08-09 | Discharge: 2012-08-09 | Disposition: A | Discharge status: Home / Self Care | Payer: BC Managed Care – PPO | Source: Ambulatory Visit | Attending: Internal Medicine | Admitting: Internal Medicine

## 2012-08-09 DIAGNOSIS — C50919 Malignant neoplasm of unspecified site of unspecified female breast: Secondary | ICD-10-CM | POA: Insufficient documentation

## 2012-08-09 DIAGNOSIS — C50519 Malignant neoplasm of lower-outer quadrant of unspecified female breast: Secondary | ICD-10-CM

## 2012-08-09 DIAGNOSIS — Z09 Encounter for follow-up examination after completed treatment for conditions other than malignant neoplasm: Secondary | ICD-10-CM | POA: Insufficient documentation

## 2012-08-09 DIAGNOSIS — Z853 Personal history of malignant neoplasm of breast: Secondary | ICD-10-CM | POA: Insufficient documentation

## 2012-08-09 LAB — CBC WITH DIFFERENTIAL/PLATELET
Basophils Absolute: 0.1 10*3/uL (ref 0.0–0.1)
Basophils Relative: 2 % — ABNORMAL HIGH (ref 0–1)
MCHC: 34.3 g/dL (ref 30.0–36.0)
Monocytes Absolute: 0.3 10*3/uL (ref 0.1–1.0)
Neutro Abs: 2.9 10*3/uL (ref 1.7–7.7)
Neutrophils Relative %: 56 % (ref 43–77)
RDW: 13.3 % (ref 11.5–15.5)

## 2012-08-09 LAB — COMPREHENSIVE METABOLIC PANEL
AST: 32 U/L (ref 0–37)
Albumin: 4 g/dL (ref 3.5–5.2)
Chloride: 102 mEq/L (ref 96–112)
Creatinine, Ser: 0.77 mg/dL (ref 0.50–1.10)
Potassium: 4.8 mEq/L (ref 3.5–5.1)
Total Bilirubin: 0.4 mg/dL (ref 0.3–1.2)
Total Protein: 7.2 g/dL (ref 6.0–8.3)

## 2012-08-09 NOTE — Progress Notes (Signed)
Labs drawn today for cbc/diff,cea,cmp,ca2729

## 2012-08-11 ENCOUNTER — Ambulatory Visit (HOSPITAL_COMMUNITY): Payer: BC Managed Care – PPO | Admitting: Oncology

## 2012-08-14 ENCOUNTER — Encounter (HOSPITAL_BASED_OUTPATIENT_CLINIC_OR_DEPARTMENT_OTHER): Payer: BC Managed Care – PPO | Admitting: Oncology

## 2012-08-14 ENCOUNTER — Encounter (HOSPITAL_COMMUNITY): Payer: Self-pay | Admitting: Oncology

## 2012-08-14 VITALS — BP 125/84 | HR 61 | Temp 98.3°F | Resp 16 | Wt 203.8 lb

## 2012-08-14 DIAGNOSIS — F430 Acute stress reaction: Secondary | ICD-10-CM

## 2012-08-14 DIAGNOSIS — C50919 Malignant neoplasm of unspecified site of unspecified female breast: Secondary | ICD-10-CM

## 2012-08-14 DIAGNOSIS — C50519 Malignant neoplasm of lower-outer quadrant of unspecified female breast: Secondary | ICD-10-CM

## 2012-08-14 NOTE — Patient Instructions (Addendum)
[facility] Discharge Instructions  RECOMMENDATIONS MADE BY THE CONSULTANT AND ANY TEST RESULTS WILL BE SENT TO YOUR REFERRING PHYSICIAN.  EXAM FINDINGS BY THE PHYSICIAN TODAY AND SIGNS OR SYMPTOMS TO REPORT TO CLINIC OR PRIMARY PHYSICIAN: Exam and discussion by MD.  No evidence of recurrence by exam.  Have your GYN send your labs to Dr. [name].  Try to get some exercise.  MEDICATIONS PRESCRIBED:  none  INSTRUCTIONS GIVEN AND DISCUSSED: Report any new lumps, bone pain or shortness of breath.  SPECIAL INSTRUCTIONS/FOLLOW-UP: Return for follow-up in 6 months.  Thank you for choosing [facility] to provide your oncology and hematology care.  To afford each patient quality time with our providers, please arrive at least 15 minutes before your scheduled appointment time.  With your help, our goal is to use those 15 minutes to complete the necessary work-up to ensure our physicians have the information they need to help with your evaluation and healthcare recommendations.    Effective January 1st, 2014, we ask that you re-schedule your appointment with our physicians should you arrive 10 or more minutes late for your appointment.  We strive to give you quality time with our providers, and arriving late affects you and other patients whose appointments are after yours.    Again, thank you for choosing [facility].  Our hope is that these requests will decrease the amount of time that you wait before being seen by our physicians.       _____________________________________________________________  Should you have questions after your visit to [facility], please contact our office at [phone] between the hours of 8:30 a.m. and 5:00 p.m.  Voicemails left after 4:30 p.m. will not be returned until the following business day.  For prescription refill requests, have your pharmacy contact our office with your prescription refill request.

## 2012-08-14 NOTE — Progress Notes (Signed)
Problem #1 stage I (T1 B., N0) intermediate grade invasive ductal carcinoma the right breast with biopsy on 11/13/2008 followed by reexcision on 12/25/2008 with no further invasive disease found. She did have some associated DCIS close to the posterior margin and a little skeletal muscle was resected for clear margins. Her cancer was ER +100%, PR negative, HER-2/neu negative. Her DCIS was ER/PR +100%. Ki-67 marker was 15% and Oncotype score was 21 indicating a recurrence rate or and 13% 10 years. The Oncotype score showed her cancer to be ER positive and weakly PR positive and HER-2/neu negative and sentinel lymph nodes were negative. She received postoperative radiation therapy, and then started on Arimidex which she tolerates very well. She occasionally has a little joint aching which may be from the drug versus H. versus estrogen absence. She will finish the drug and 2015 approximately the end of June. Tumor size was 7 mm and grade 2/3. No LV I was seen. Problem #2 dry eye syndrome with associated dry mouth which was worsened by the Arimidex Problem #3 mild excessive weight Problem #4 bilateral carpal tunnel surgery Problem #5 colonoscopy in September 2012 with a benign polyp found  She is doing very well except for a few issues which we discussed. She has quite a bit of stress and is doing as well as she can with that. She is sleeping well. Bowel functions quite good. Appetite is too good. She will consider exercising on a regular basis and taking time out for herself.  Her vital signs are stable. Lymph nodes remain negative throughout. Did not feel her thyroid be enlarged. The right breast is negative except for mild surgical/radiation therapy changes at best. Left breast is negative for masses. Lungs are clear. Heart shows a regular rhythm and rate without murmur rub or gallop. Abdomen is soft and nontender without organomegaly. She has no arm or leg edema. Skin exam is unremarkable except for dryness.  HEENT exam is unremarkable. She is alert and oriented.  We had a long discussion about her stress issues she will try to work on those. She has actually been working on those as well. She had some lipids checked at her gynecologist and she will have those sent to Dr. [name] for review. Her blood work from our standpoint to we did is excellent. We will see her back in 6 months after her bone density. I Do not plan blood work until one year from now.

## 2012-08-31 ENCOUNTER — Other Ambulatory Visit (HOSPITAL_COMMUNITY): Payer: Self-pay | Admitting: Oncology

## 2012-08-31 ENCOUNTER — Telehealth (HOSPITAL_COMMUNITY): Payer: Self-pay | Admitting: Oncology

## 2012-08-31 DIAGNOSIS — C50911 Malignant neoplasm of unspecified site of right female breast: Secondary | ICD-10-CM

## 2012-08-31 MED ORDER — ANASTROZOLE 1 MG PO TABS: 1.0000 mg | ORAL_TABLET | Freq: Every day | ORAL | Status: DC

## 2012-08-31 NOTE — Telephone Encounter (Signed)
[facility] ([facility])

## 2012-09-11 ENCOUNTER — Encounter: Payer: Self-pay | Admitting: Oncology

## 2012-09-27 ENCOUNTER — Other Ambulatory Visit: Payer: Self-pay | Admitting: Obstetrics & Gynecology

## 2012-09-27 DIAGNOSIS — Z853 Personal history of malignant neoplasm of breast: Secondary | ICD-10-CM

## 2012-09-29 ENCOUNTER — Encounter (INDEPENDENT_AMBULATORY_CARE_PROVIDER_SITE_OTHER): Payer: Self-pay | Admitting: General Surgery

## 2012-09-29 ENCOUNTER — Other Ambulatory Visit (INDEPENDENT_AMBULATORY_CARE_PROVIDER_SITE_OTHER): Payer: Self-pay

## 2012-09-29 ENCOUNTER — Ambulatory Visit (INDEPENDENT_AMBULATORY_CARE_PROVIDER_SITE_OTHER): Payer: BC Managed Care – PPO | Admitting: General Surgery

## 2012-09-29 ENCOUNTER — Telehealth (INDEPENDENT_AMBULATORY_CARE_PROVIDER_SITE_OTHER): Payer: Self-pay | Admitting: General Surgery

## 2012-09-29 VITALS — BP 120/78 | HR 72 | Temp 99.7°F | Resp 18 | Ht 65.5 in | Wt 203.6 lb

## 2012-09-29 DIAGNOSIS — C50919 Malignant neoplasm of unspecified site of unspecified female breast: Secondary | ICD-10-CM

## 2012-09-29 DIAGNOSIS — E041 Nontoxic single thyroid nodule: Secondary | ICD-10-CM

## 2012-09-29 DIAGNOSIS — C50911 Malignant neoplasm of unspecified site of right female breast: Secondary | ICD-10-CM

## 2012-09-29 NOTE — Patient Instructions (Signed)
Continue arimidex Continue regular self exams 

## 2012-09-29 NOTE — Telephone Encounter (Signed)
Spoke with patient she is aware of appt on 10/06/12 @11 :15 [address]

## 2012-10-02 ENCOUNTER — Encounter (INDEPENDENT_AMBULATORY_CARE_PROVIDER_SITE_OTHER): Payer: Self-pay | Admitting: General Surgery

## 2012-10-02 NOTE — Progress Notes (Signed)
Subjective:     Patient ID: Kelsey Andrews, female   DOB: 10/12/1950, 62 y.o.   MRN: [id number]  HPI The patient is a 62 year old female who is 3-1/2 years status post right lumpectomy and negative sentinel node biopsy for a T1 B. N0 right breast cancer. She is taken anastrozole and tolerated well. She denies any breast pain. Her main complaint is of difficulty swallowing. She feels as though she may have a thyroid nodule causing some compression. She denies any heart or cold intolerance. She denies any recent unintentional weight loss or weight gain  Review of Systems  Constitutional: Negative.   HENT: Negative.   Eyes: Negative.   Respiratory: Negative.   Cardiovascular: Negative.   Gastrointestinal: Negative.   Endocrine: Negative.   Genitourinary: Negative.   Musculoskeletal: Negative.   Skin: Negative.   Allergic/Immunologic: Negative.   Neurological: Negative.   Hematological: Negative.   Psychiatric/Behavioral: Negative.        Objective:   Physical Exam  Constitutional: She is oriented to person, place, and time. She appears well-developed and well-nourished.  HENT:  Head: Normocephalic and atraumatic.  Eyes: Conjunctivae and EOM are normal. Pupils are equal, round, and reactive to light.  Neck: Normal range of motion. Neck supple.  I did not appreciate any significant enlargement of the thyroid gland today  Cardiovascular: Normal rate, regular rhythm and normal heart sounds.   Pulmonary/Chest: Effort normal and breath sounds normal.  Her right breast incision is healed nicely. There is no palpable mass in either breast. There is no palpable axillary or supraclavicular cervical lymphadenopathy  Abdominal: Soft. Bowel sounds are normal. She exhibits no mass. There is no tenderness.  Musculoskeletal: Normal range of motion.  Lymphadenopathy:    She has no cervical adenopathy.  Neurological: She is alert and oriented to person, place, and time.  Skin: Skin is warm and  dry.  Psychiatric: She has a normal mood and affect. Her behavior is normal.       Assessment:     The patient is 3-1/2 years status post right lumpectomy for breast cancer     Plan:     At this point she will continue to do regular self exams. She will continue to take anastrozole. We will plan to obtain a thyroid ultrasound. If this is negative we will plan to see her back in about 1 year.

## 2012-10-06 ENCOUNTER — Ambulatory Visit
Admission: RE | Admit: 2012-10-06 | Discharge: 2012-10-06 | Disposition: A | Discharge status: Home / Self Care | Payer: BC Managed Care – PPO | Source: Ambulatory Visit | Attending: General Surgery | Admitting: General Surgery

## 2012-10-06 DIAGNOSIS — E041 Nontoxic single thyroid nodule: Secondary | ICD-10-CM

## 2012-10-09 ENCOUNTER — Telehealth (INDEPENDENT_AMBULATORY_CARE_PROVIDER_SITE_OTHER): Payer: Self-pay

## 2012-10-09 NOTE — Telephone Encounter (Signed)
LMOM to call back. Thyroid US was negative. Follow up in 1 year for breast check.

## 2012-10-10 NOTE — Telephone Encounter (Signed)
Pt called and was given [place] results.  Also reminded of 1 year breast check.

## 2012-12-01 ENCOUNTER — Ambulatory Visit
Admission: RE | Admit: 2012-12-01 | Discharge: 2012-12-01 | Disposition: A | Discharge status: Home / Self Care | Payer: BC Managed Care – PPO | Source: Ambulatory Visit | Attending: Obstetrics & Gynecology | Admitting: Obstetrics & Gynecology

## 2012-12-01 DIAGNOSIS — Z853 Personal history of malignant neoplasm of breast: Secondary | ICD-10-CM

## 2013-02-12 ENCOUNTER — Other Ambulatory Visit (HOSPITAL_COMMUNITY): Payer: BC Managed Care – PPO

## 2013-02-16 ENCOUNTER — Ambulatory Visit (HOSPITAL_COMMUNITY)
Admission: RE | Admit: 2013-02-16 | Discharge: 2013-02-16 | Disposition: A | Discharge status: Home / Self Care | Payer: BC Managed Care – PPO | Source: Ambulatory Visit | Attending: Oncology | Admitting: Oncology

## 2013-02-16 DIAGNOSIS — M8580 Other specified disorders of bone density and structure, unspecified site: Secondary | ICD-10-CM

## 2013-02-16 DIAGNOSIS — M899 Disorder of bone, unspecified: Secondary | ICD-10-CM | POA: Insufficient documentation

## 2013-02-23 ENCOUNTER — Encounter (HOSPITAL_COMMUNITY): Payer: BC Managed Care – PPO | Attending: Hematology and Oncology

## 2013-02-23 ENCOUNTER — Encounter (HOSPITAL_COMMUNITY): Payer: Self-pay

## 2013-02-23 VITALS — BP 117/78 | HR 64 | Temp 97.8°F | Resp 16 | Wt 198.5 lb

## 2013-02-23 DIAGNOSIS — M899 Disorder of bone, unspecified: Secondary | ICD-10-CM

## 2013-02-23 DIAGNOSIS — B373 Candidiasis of vulva and vagina: Secondary | ICD-10-CM

## 2013-02-23 DIAGNOSIS — C50519 Malignant neoplasm of lower-outer quadrant of unspecified female breast: Secondary | ICD-10-CM

## 2013-02-23 DIAGNOSIS — C50911 Malignant neoplasm of unspecified site of right female breast: Secondary | ICD-10-CM

## 2013-02-23 DIAGNOSIS — M8580 Other specified disorders of bone density and structure, unspecified site: Secondary | ICD-10-CM

## 2013-02-23 MED ORDER — ANASTROZOLE 1 MG PO TABS: 1.0000 mg | ORAL_TABLET | Freq: Every day | ORAL | Status: DC

## 2013-02-23 NOTE — Patient Instructions (Addendum)
[facility] Discharge Instructions  RECOMMENDATIONS MADE BY THE CONSULTANT AND ANY TEST RESULTS WILL BE SENT TO YOUR REFERRING PHYSICIAN.  Continue the Arimidex as prescribed. Avoid use of any products that contain Estrogen. Based on your Bone Density results the doctor recommends that you take at least 1200 mg Calcium daily plus 800 mg Vitamin D. Return to clinic in 6 months to see MD. Report any issues/concerns to clinic as needed prior to appointments.  Thank you for choosing [facility] to provide your oncology and hematology care.  To afford each patient quality time with our providers, please arrive at least 15 minutes before your scheduled appointment time.  With your help, our goal is to use those 15 minutes to complete the necessary work-up to ensure our physicians have the information they need to help with your evaluation and healthcare recommendations.    Effective January 1st, 2014, we ask that you re-schedule your appointment with our physicians should you arrive 10 or more minutes late for your appointment.  We strive to give you quality time with our providers, and arriving late affects you and other patients whose appointments are after yours.    Again, thank you for choosing [facility].  Our hope is that these requests will decrease the amount of time that you wait before being seen by our physicians.       _____________________________________________________________  Should you have questions after your visit to [facility], please contact our office at [phone] between the hours of 8:30 a.m. and 5:00 p.m.  Voicemails left after 4:30 p.m. will not be returned until the following business day.  For prescription refill requests, have your pharmacy contact our office with your prescription refill request.

## 2013-02-23 NOTE — Progress Notes (Signed)
[facility] [phone]   [phone]  OFFICE PROGRESS NOTE  [name], MD [address] [place] [address]  DIAGNOSIS: stage I (T1 B., N0) intermediate grade invasive ductal carcinoma the right breast  INTERVAL HISTORY:   She had a core biopsy of the right breast and 11/13/2008 which showed DCIS. She had lumpectomy on 12/10/2008 which showed invasive ductal carcinoma grade 2/3 that was 0.7 cm in greatest dimension.  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  Sentinel lymph node biopsy was negative for evidence of metastasis. Tumor was ER positive PR negative HER-2/neu negative. She had repeat resection to clear margins on 12/26/2008 and this was reported as;  Marland Kitchen BREAST, DEEP MARGIN OF RIGHT LUMPECTOMY CAVITY, EXCISION: - ONE FOCUS OF INTERMEDIATE NUCLEAR GRADE DUCTAL CARCINOMA IN SITU, CLOSE TO THE INKED NEW DEEP MARGIN. - SKELETAL MUSCLE TISSUE PRESENT, NEGATIVE FOR CARCINOMA.   She was treated with postoperative radiation and then started on Arimidex which according to our records she is a scheduled to complete at the end of June 2015.   Kelsey Andrews 62 y.o. female returns to the clinic today for scheduled follow up.  She denies any new problem.  She has chronic vaginal dryness which she reportedly uses non estrogen for lubricants during sexual intercourse.  She denies any new breast lumps.  She states that she is up-to-date with her mammogram.  She denies any bone pain or shortness of breath or significant weight loss. Recent Dexa scan reviewed and noted . Last mammogram on 12/01/2012 and was read as BI-RADS 2 benign finding.  MEDICAL HISTORY: Past Medical History  Diagnosis Date  . Thyroid activity decreased   . Breast cancer     rt.  . Melanoma of shoulder   . Adenomatous colon polyp     ALLERGIES:  is  allergic to morphine and related and penicillins.  MEDICATIONS:  Current Outpatient Prescriptions  Medication Sig Dispense Refill  . anastrozole (ARIMIDEX) 1 MG tablet Take 1 tablet (1 mg total) by mouth daily.  90 tablet  3  . aspirin 81 MG EC tablet Take 81 mg by mouth daily.        . Cholecalciferol (VITAMIN D) 2000 UNITS tablet Take 2,000 Units by mouth daily.        . citalopram (CELEXA) 10 MG tablet Take 20 mg by mouth daily.      . hydrOXYzine (ATARAX) 10 MG tablet Take 10 mg by mouth 2 (two) times daily as needed.        Marland Kitchen ibuprofen (ADVIL,MOTRIN) 800 MG tablet 800 mg every 8 (eight) hours as needed.       Marland Kitchen levothyroxine (SYNTHROID, LEVOTHROID) 137 MCG tablet Take 137 mcg by mouth daily.        . Multiple Vitamins-Minerals (MULTIVITAMIN WITH MINERALS) tablet Take 1 tablet by mouth daily.        . Probiotic Product (PROBIOTIC DAILY PO) Take by mouth. Contains calcium 200mg        No current facility-administered medications for this visit.    SURGICAL HISTORY:  Past Surgical History  Procedure Laterality Date  . Breast lumpectomy      rt.  . Tissue expander  removal w/ replacement of implant      rt. breast tissue removed  . Laparoscopic total hysterectomy    . Tonscellectomy    . Btl    .  Removal of melanoma      Rt shoulder  . Carpel tunnel      both  . Colonoscopy       REVIEW OF SYSTEMS: 14 point review of system is as in the history above otherwise negative.   PHYSICAL EXAMINATION:  Blood pressure 117/78, pulse 64, temperature 97.8 F (36.6 C), temperature source Oral, resp. rate 16, weight 198 lb 8 oz (90.039 kg). GENERAL: No acute distress. SKIN:  No rashes or significant lesions . No ecchymosis or petechial rash. HEAD: Normocephalic, No masses, lesions, tenderness or abnormalities  LYMPH: No palpable lymphadenopathy, in the neck, supraclavicular areas or axilla. BREAST:posttreatment changes left breast otherwise unremarkable breast exam on both sides.  No  palpable masses. LUNGS: Clear to auscultation , no crackles or wheezes HEART: regular rate & rhythm, no murmurs, no gallops, S1 normal and S2 normal and no S3. ABDOMEN: Abdomen soft, non-tender, no masses or organomegaly and no hepatosplenomegaly palpable EXTREMITIES: No edema, no skin discoloration or tenderness NEURO: Alert & oriented , no focal motor  deficits.     LABORATORY DATA: Lab Results  Component Value Date   WBC 5.2 08/09/2012   HGB 13.7 08/09/2012   HCT 40.0 08/09/2012   MCV 87.7 08/09/2012   PLT 286 08/09/2012      Chemistry      Component Value Date/Time   NA 139 08/09/2012 0833   K 4.8 08/09/2012 0833   CL 102 08/09/2012 0833   CO2 28 08/09/2012 0833   BUN 11 08/09/2012 0833   CREATININE 0.77 08/09/2012 0833      Component Value Date/Time   CALCIUM 10.4 08/09/2012 0833   ALKPHOS 100 08/09/2012 0833   AST 32 08/09/2012 0833   ALT 36* 08/09/2012 0833   BILITOT 0.4 08/09/2012 0833       RADIOGRAPHIC STUDIES: Dg Bone Density  02/16/2013   The Bone Mineral Densitometry hard-copy report (which includes all data, graphical display, and FRAX results when applicable) has been sent directly to the ordering physician.  This report can also be obtained electronically by viewing images for this exam through the performing facility's EMR, or by logging directly into [employer].   Original Report Authenticated By: Natasha Mead, M.D.  Images were reviewed.   ASSESSMENT:  Patient has no evidence of disease at this time is recurrence of breast cancer.  We talked about adjuvant endocrine therapy and informed her that 5 years of Arimidex inhibitor suffices as standard of care at this present time.  She has a mild osteopenia of the left femur with a T score of -1.2.  She is only on an vitamin D. She has some vaginal dryness and she is using lubricants that are non estrogen based during intercourse.  PLAN:  1. Return to clinic in 6 months.   2. Renewed prescription for Arimidex.  Intent is  to completely sometime in June 2014 based on her records. 3. I recommend a calcium/vitamin D  1200 mg /800 units per day and to vit D 2000 units once she begins this. 4. Continue yearly mammogram.    All questions were satisfactorily answered. Patient knows to call if  any concern arises.  I spent more than 50 % counseling the patient face to face. The total time spent in the appointment was 30 minutes.   Sherral Hammers, MD FACP. Hematology/Oncology.

## 2013-05-03 ENCOUNTER — Other Ambulatory Visit: Payer: Self-pay

## 2013-08-23 ENCOUNTER — Encounter (HOSPITAL_COMMUNITY): Payer: Self-pay | Admitting: Oncology

## 2013-08-23 NOTE — Progress Notes (Signed)
Kelsey Noble, MD [address] [place] [address]  Invasive ductal carcinoma of right breast, stage 1  CURRENT THERAPY: Arimidex daily.  Chart notes that she started Arimidex on 12/18/2008, but she notes that she started following completion of radiation in September.  She will therefore finish AI therapy in September 2015.   INTERVAL HISTORY: Kelsey Andrews 63 y.o. female returns for  regular  visit for followup of a Stage I (T1BN0) invasive ductal carcinoma of the right breast, intermediate grade. A core biopsy of the right breast and 11/13/2008 which showed DCIS. She had lumpectomy on 12/10/2008 which showed invasive ductal carcinoma grade 2/3 that was 0.7 cm in greatest dimension. 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 Sentinel lymph node biopsy was negative for evidence of metastasis.  Tumor was ER positive PR negative HER-2/neu negative. She had repeat resection to clear margins on 12/26/2008 and this was reported as:  - ONE FOCUS OF INTERMEDIATE NUCLEAR GRADE DUCTAL CARCINOMA IN SITU, CLOSE TO THE INKED NEW DEEP MARGIN. - SKELETAL MUSCLE TISSUE PRESENT, NEGATIVE FOR CARCINOMA. She was therefore treated with postoperative radiation and then started on Arimidex which according to our records she is a scheduled to complete at the end of June 2015, but the patient thinks she started in September 2010.  She will continue with Arimidex and finish 5 years worth of therapy in September 2015.     Invasive ductal carcinoma of right breast, stage 1   11/13/2008 Initial Diagnosis Core biopsy of right breast showing DCIS   12/10/2008 Surgery Lumpectomy of right breast- demonstrating invasive ductal carcinoma, 0.7 cm, grade II, with positive DCIS at the posterior marked margin.  Negative sentinel lymph node biopsy   12/26/2008 Surgery Repeat resection to clear margins   01/30/2009 - 03/19/2009 Radiation Therapy Dr. Pablo Andrews   03/20/2009 -  Chemotherapy Approximate start date of Arimidex.  Will complete 5 years worth of therapy in September 2015   I personally reviewed and went over laboratory results with the patient.  The results are noted within this dictation.  I personally reviewed and went over radiographic studies with the patient.  The results are noted within this dictation.  Mammogram on 12/01/2012 was BIRADS 2.  She will be due for her next screening mammogram in June 2015.  Bone density is stable.  NCCN guidelines recommends the following surveillance for invasive breast cancer:  A. History and Physical exam every 4-6 months for 5 years and then every 12 months.  B. Mammography every 12 months  C. Women on Tamoxifen: annual gynecologic assessment every 12 months if uterus is present.  D. Women on aromatase inhibitor or who experience ovarian failure secondary to treatment should have monitoring of bone health with a bone mineral density determination at baseline and periodically thereafter.  E. Assess and encourage adherence to adjuvant endocrine therapy.  F. Evidence suggests that active lifestyle and achieving and maintaining an ideal body weight (20-25 BMI) may lead to optimal breast cancer outcomes.  She continues to have vaginal dryness which she is treating symptomatically.   Oncologically, she otherwise denies any complaints and ROS questioning is negative.   Past Medical History  Diagnosis Date  . Thyroid activity decreased   . Breast cancer     rt.  . Melanoma of shoulder   . Adenomatous colon polyp   . Invasive ductal carcinoma of right breast,  stage 1 08/25/2011    Started Arimidex on 07/20/2008.  Stage I (T1b N0) intermediate grade invasive ductal carcinoma of the right breast with biopsy on 11/13/2008, re-excision on 12/25/2008 with no further invasive disease found. She did have some associated ductal carcinoma in situ close to the  posterior margin and a little skeletal muscle had to be resected for clear margins. Her invasive disease was ER positive at 10    has Invasive ductal carcinoma of right breast, stage 1 on her problem list.     is allergic to morphine and related and penicillins.  [name] does not currently have medications on file.  Past Surgical History  Procedure Laterality Date  . Breast lumpectomy      rt.  . Tissue expander  removal w/ replacement of implant      rt. breast tissue removed  . Laparoscopic total hysterectomy    . Tonscellectomy    . Btl    . Removal of melanoma      Rt shoulder  . Carpel tunnel      both  . Colonoscopy      Denies any headaches, dizziness, double vision, fevers, chills, night sweats, nausea, vomiting, diarrhea, constipation, chest pain, heart palpitations, shortness of breath, blood in stool, black tarry stool, urinary pain, urinary burning, urinary frequency, hematuria.   PHYSICAL EXAMINATION  ECOG PERFORMANCE STATUS: 0 - Asymptomatic  Filed Vitals:   08/24/13 1545  BP: 137/80  Pulse: 74  Temp: 98 F (36.7 C)  Resp: 16    GENERAL:alert, healthy, no distress, well nourished, well developed, comfortable, cooperative, obese and smiling SKIN: skin color, texture, turgor are normal, no rashes or significant lesions HEAD: Normocephalic, No masses, lesions, tenderness or abnormalities EYES: normal, PERRLA, EOMI, Conjunctiva are pink and non-injected EARS: External ears normal OROPHARYNX:lips, buccal mucosa, and tongue normal and mucous membranes are moist  NECK: supple, no adenopathy, thyroid normal size, non-tender, without nodularity, no stridor, non-tender, trachea midline LYMPH:  no palpable lymphadenopathy, no hepatosplenomegaly BREAST:right breast normal without mass, skin or nipple changes or axillary nodes with post-lumpectomy site noted, left breast normal without mass, skin or nipple changes or axillary nodes LUNGS: clear to auscultation and  percussion HEART: regular rate & rhythm, no murmurs, no gallops, S1 normal and S2 normal ABDOMEN:abdomen soft, non-tender, obese, normal bowel sounds, no masses or organomegaly and no hepatosplenomegaly BACK: Back symmetric, no curvature., No CVA tenderness EXTREMITIES:less then 2 second capillary refill, no joint deformities, effusion, or inflammation, no edema, no skin discoloration, no clubbing, no cyanosis  NEURO: alert & oriented x 3 with fluent speech, no focal motor/sensory deficits, gait normal   LABORATORY DATA: CBC    Component Value Date/Time   WBC 5.2 08/09/2012 0833   RBC 4.56 08/09/2012 0833   HGB 13.7 08/09/2012 0833   HCT 40.0 08/09/2012 0833   PLT 286 08/09/2012 0833   MCV 87.7 08/09/2012 0833   MCH 30.0 08/09/2012 0833   MCHC 34.3 08/09/2012 0833   RDW 13.3 08/09/2012 0833   LYMPHSABS 1.6 08/09/2012 0833   MONOABS 0.3 08/09/2012 0833   EOSABS 0.4 08/09/2012 0833   BASOSABS 0.1 08/09/2012 0833      Chemistry      Component Value Date/Time   NA 139 08/09/2012 0833   K 4.8 08/09/2012 0833   CL 102 08/09/2012 0833   CO2 28 08/09/2012 0833   BUN 11 08/09/2012 0833   CREATININE 0.77 08/09/2012 0833      Component Value Date/Time  CALCIUM 10.4 08/09/2012 0833   ALKPHOS 100 08/09/2012 0833   AST 32 08/09/2012 0833   ALT 36* 08/09/2012 0833   BILITOT 0.4 08/09/2012 0833        RADIOGRAPHIC STUDIES:  12/01/2012  *RADIOLOGY REPORT*  Clinical Data: Status post right lumpectomy and radiation therapy  for breast cancer in 2010.  DIGITAL DIAGNOSTIC BILATERAL MAMMOGRAM WITH CAD  Comparison: Previous examinations.  Findings:  ACR Breast Density Category 2: There is a scattered fibroglandular  pattern.  Stable post lumpectomy changes on the right. No new findings  suspicious for malignancy in either breast.  Mammographic images were processed with CAD.  IMPRESSION:  No evidence of malignancy.  RECOMMENDATION:  Bilateral diagnostic mammogram in 1 year.  I have discussed the  findings and recommendations with the patient.  Results were also provided in writing at the conclusion of the  visit. If applicable, a reminder letter will be sent to the  patient regarding her next appointment.  BI-RADS CATEGORY 2: Benign finding(s).  Original Report Authenticated By: Claudie Revering, M.D.    ASSESSMENT:  1. 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 A core biopsy of the right breast and 11/13/2008 which showed DCIS. She had lumpectomy on 12/10/2008 which showed invasive ductal carcinoma grade 2/3 that was 0.7 cm in greatest dimension. 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 Sentinel lymph node biopsy was negative for evidence of metastasis.  Tumor was ER positive PR negative HER-2/neu negative. She had repeat resection to clear margins on 12/26/2008 and this was reported as:  - ONE FOCUS OF INTERMEDIATE NUCLEAR GRADE DUCTAL CARCINOMA IN SITU, CLOSE TO THE INKED NEW DEEP MARGIN. - SKELETAL MUSCLE TISSUE PRESENT, NEGATIVE FOR CARCINOMA. She was therefore treated with postoperative radiation and then started on Arimidex which according to our records she is a scheduled to complete at the end of June 2015, but the patient thinks she started in September 2010.  She will continue with Arimidex and finish 5 years worth of therapy in September 2015. 2. Osteopenia of left femur with a T-score of -1.2.  On Ca++ and Vit D. Next bone density is due in August 2016. 3. Vaginal dryness, treating symptomatically.   Patient Active Problem List   Diagnosis Date Noted  . Invasive ductal carcinoma of right breast, stage 1 08/25/2011     PLAN:  1. I personally reviewed and went over laboratory results with the patient.  The results are noted within this dictation. 2. I personally reviewed and went over radiographic studies with the patient.  The  results are noted within this dictation.   3. Next screening mammogram is due in June 2015 4. Next bone density exam is due in August 2016 5. Paper chart reviewed to verify date of Arimidex initiation and calculation of completion of therapy after 5 years.  Appears to be September 2010 start date and therefore we will plan for a September 2015 end date. 6. Continue Ca++ and Vit D for osteopenia 7. Continue Arimidex to complete 5 years worth of therapy.  8. Labs in October 2015: CBC diff, CMET 9. Return in October 2015  for follow-up, then annually.    THERAPY PLAN:  She is to continue with annual screening mammogram.  Additionally, she is to have a bone density in August 2016, and then we will defer to PCP for further follow-up.  If she develops osteoporosis,  I would recommend Prolia versus Reclast (annually).  In the interim, we will perform surveillance per NCCN guidelines.  NCCN guidelines recommends the following surveillance for invasive breast cancer:  A. History and Physical exam every 4-6 months for 5 years and then every 12 months.  B. Mammography every 12 months  C. Women on Tamoxifen: annual gynecologic assessment every 12 months if uterus is present.  D. Women on aromatase inhibitor or who experience ovarian failure secondary to treatment should have monitoring of bone health with a bone mineral density determination at baseline and periodically thereafter.  E. Assess and encourage adherence to adjuvant endocrine therapy.  F. Evidence suggests that active lifestyle and achieving and maintaining an ideal body weight (20-25 BMI) may lead to optimal breast cancer outcomes.   All questions were answered. The patient knows to call the clinic with any problems, questions or concerns. We can certainly see the patient much sooner if necessary.  Patient and plan discussed with Dr. Farrel Gobble and he is in agreement with the aforementioned.   KEFALAS,THOMAS

## 2013-08-24 ENCOUNTER — Ambulatory Visit (HOSPITAL_COMMUNITY): Payer: BC Managed Care – PPO | Admitting: Oncology

## 2013-08-24 ENCOUNTER — Encounter (HOSPITAL_COMMUNITY): Payer: BC Managed Care – PPO | Attending: Oncology | Admitting: Oncology

## 2013-08-24 ENCOUNTER — Encounter (HOSPITAL_COMMUNITY): Payer: Self-pay | Admitting: Oncology

## 2013-08-24 VITALS — BP 137/80 | HR 74 | Temp 98.0°F | Resp 16 | Wt 208.7 lb

## 2013-08-24 DIAGNOSIS — C50911 Malignant neoplasm of unspecified site of right female breast: Secondary | ICD-10-CM

## 2013-08-24 DIAGNOSIS — M899 Disorder of bone, unspecified: Secondary | ICD-10-CM

## 2013-08-24 DIAGNOSIS — C50519 Malignant neoplasm of lower-outer quadrant of unspecified female breast: Secondary | ICD-10-CM

## 2013-08-24 DIAGNOSIS — Z170 Estrogen receptor positive status [ER+]: Secondary | ICD-10-CM

## 2013-08-24 DIAGNOSIS — M949 Disorder of cartilage, unspecified: Secondary | ICD-10-CM

## 2013-08-24 NOTE — Patient Instructions (Addendum)
[facility] Discharge Instructions  RECOMMENDATIONS MADE BY THE CONSULTANT AND ANY TEST RESULTS WILL BE SENT TO YOUR REFERRING PHYSICIAN.  EXAM FINDINGS BY THE PHYSICIAN TODAY AND SIGNS OR SYMPTOMS TO REPORT TO CLINIC OR PRIMARY PHYSICIAN: Exam and findings as discussed by Robynn Pane, PA-C.  Can stop the arimidex at the end of September.  Report any new lumps, bone pain, shortness of breath or other symptoms.  Mammogram when due.  MEDICATIONS PRESCRIBED:  none  INSTRUCTIONS/FOLLOW-UP: Follow-up in October.   Thank you for choosing [facility] to provide your oncology and hematology care.  To afford each patient quality time with our providers, please arrive at least 15 minutes before your scheduled appointment time.  With your help, our goal is to use those 15 minutes to complete the necessary work-up to ensure our physicians have the information they need to help with your evaluation and healthcare recommendations.    Effective January 1st, 2014, we ask that you re-schedule your appointment with our physicians should you arrive 10 or more minutes late for your appointment.  We strive to give you quality time with our providers, and arriving late affects you and other patients whose appointments are after yours.    Again, thank you for choosing [facility].  Our hope is that these requests will decrease the amount of time that you wait before being seen by our physicians.       _____________________________________________________________  Should you have questions after your visit to [facility], please contact our office at [phone] between the hours of 8:30 a.m. and 5:00 p.m.  Voicemails left after 4:30 p.m. will not be returned until the following business day.  For prescription refill requests, have your pharmacy contact our office with your prescription refill request.

## 2013-10-02 ENCOUNTER — Encounter (INDEPENDENT_AMBULATORY_CARE_PROVIDER_SITE_OTHER): Payer: Self-pay | Admitting: General Surgery

## 2013-10-15 ENCOUNTER — Other Ambulatory Visit: Payer: Self-pay

## 2013-10-15 ENCOUNTER — Other Ambulatory Visit: Payer: Self-pay | Admitting: Obstetrics & Gynecology

## 2013-10-15 DIAGNOSIS — Z853 Personal history of malignant neoplasm of breast: Secondary | ICD-10-CM

## 2013-12-03 ENCOUNTER — Ambulatory Visit
Admission: RE | Admit: 2013-12-03 | Discharge: 2013-12-03 | Disposition: A | Discharge status: Home / Self Care | Payer: BC Managed Care – PPO | Source: Ambulatory Visit | Attending: Obstetrics & Gynecology | Admitting: Obstetrics & Gynecology

## 2013-12-03 DIAGNOSIS — Z853 Personal history of malignant neoplasm of breast: Secondary | ICD-10-CM

## 2013-12-07 ENCOUNTER — Ambulatory Visit (INDEPENDENT_AMBULATORY_CARE_PROVIDER_SITE_OTHER): Payer: BC Managed Care – PPO | Admitting: General Surgery

## 2014-01-10 ENCOUNTER — Other Ambulatory Visit (HOSPITAL_COMMUNITY): Payer: Self-pay | Admitting: Oncology

## 2014-03-11 ENCOUNTER — Other Ambulatory Visit: Payer: Self-pay | Admitting: Obstetrics and Gynecology

## 2014-03-13 LAB — CYTOLOGY - PAP

## 2014-04-02 ENCOUNTER — Encounter (HOSPITAL_COMMUNITY): Payer: Self-pay | Admitting: Oncology

## 2014-04-02 DIAGNOSIS — M8580 Other specified disorders of bone density and structure, unspecified site: Secondary | ICD-10-CM

## 2014-04-02 HISTORY — DX: Other specified disorders of bone density and structure, unspecified site: M85.80

## 2014-04-02 NOTE — Progress Notes (Signed)
Kelsey Noble, MD [address] [place] [address]  Invasive ductal carcinoma of right breast, stage 1 - Plan: CBC with Differential, Comprehensive metabolic panel, DG Bone Density  Osteopenia - Plan: DG Bone Density  CURRENT THERAPY: Arimidex daily. Chart notes that she started Arimidex on 12/18/2008, but she notes that she started following completion of radiation in September. She therefore finished AI therapy in September 2015.   INTERVAL HISTORY: Kelsey Andrews 63 y.o. 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 A core biopsy of the right breast and 11/13/2008 which showed DCIS. She had lumpectomy on 12/10/2008 which showed invasive ductal carcinoma grade 2/3 that was 0.7 cm in greatest dimension. 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 Sentinel lymph node biopsy was negative for evidence of metastasis.  Tumor was ER positive PR negative HER-2/neu negative. She had repeat resection to clear margins on 12/26/2008 and this was reported as:  - ONE FOCUS OF INTERMEDIATE NUCLEAR GRADE DUCTAL CARCINOMA IN SITU, CLOSE TO THE INKED NEW DEEP MARGIN. - SKELETAL MUSCLE TISSUE PRESENT, NEGATIVE FOR CARCINOMA. She was therefore treated with postoperative radiation and then started on Arimidex which according to our records she is a scheduled to complete at the end of June 2015, but the patient thinks she started in September 2010. She will continue with Arimidex and finish 5 years worth of therapy in September 2015.     Invasive ductal carcinoma of right breast, stage 1   11/13/2008 Initial Diagnosis Core biopsy of right breast showing DCIS   12/10/2008 Surgery Lumpectomy of right breast- demonstrating invasive ductal carcinoma, 0.7 cm, grade II, with positive DCIS at the posterior  marked margin.  Negative sentinel lymph node biopsy   12/26/2008 Surgery Repeat resection to clear margins   01/30/2009 - 03/19/2009 Radiation Therapy Dr. Pablo Ledger   03/20/2009 - 03/27/2014 Chemotherapy Approximate start date of Arimidex.  Will complete 5 years worth of therapy in September 2015    I personally reviewed and went over laboratory results with the patient.  The results are noted within this dictation.  We will perform labs today to update her results now that she is at the conclusion of AI therapy.  I personally reviewed and went over radiographic studies with the patient.  The results are noted within this dictation.  Mammogram on 12/03/2013 was BIRADS 2.  She will be due for a bone density exam in August 2016.  She is very pleased to have completed 5 years worth of adjuvant endocrine therapy.  We reviewed the NCCN guidelines which recommends annual mammography and annual office visits.  We will continue surveillance for 5 more years and then discuss release from [facility] if she desires.  She asked about surveillance imaging.  The only imaging that is indicated for surveillance is mammography.  I have deferred regular mammography versus 3D mammography to her.  There is no role for surveillance imaging tests such as chest xrays, CT scans, etc.  The only need to perform imaging tests is if there is clinical or symptomatic complaints worrisome for recurrence/relapse.  I provided her some information regarding survivorship of breast cancer.  She knows to call us with any questions or concerns.  Oncologically, she denies any complaints and ROS questioning is negative.   Past Medical History  Diagnosis Date  .  Thyroid activity decreased   . Breast cancer     rt.  . Melanoma of shoulder   . Adenomatous colon polyp   . Invasive ductal carcinoma of right breast, stage 1 08/25/2011    Started Arimidex on 07/20/2008.  Stage I (T1b N0) intermediate grade invasive ductal carcinoma of the right breast  with biopsy on 11/13/2008, re-excision on 12/25/2008 with no further invasive disease found. 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 Her invasive disease was ER positive at 10  . Osteopenia 04/02/2014    has Invasive ductal carcinoma of right breast, stage 1 and Osteopenia on her problem list.     is allergic to morphine and related and penicillins.  We administered Influenza vac split quadrivalent PF.  Past Surgical History  Procedure Laterality Date  . Breast lumpectomy      rt.  . Tissue expander  removal w/ replacement of implant      rt. breast tissue removed  . Laparoscopic total hysterectomy    . Tonscellectomy    . Btl    . Removal of melanoma      Rt shoulder  . Carpel tunnel      both  . Colonoscopy      Denies any headaches, dizziness, double vision, fevers, chills, night sweats, nausea, vomiting, diarrhea, constipation, chest pain, heart palpitations, shortness of breath, blood in stool, black tarry stool, urinary pain, urinary burning, urinary frequency, hematuria.   PHYSICAL EXAMINATION  ECOG PERFORMANCE STATUS: 0 - Asymptomatic  Filed Vitals:   04/05/14 1536  BP: 140/78  Pulse: 86  Temp: 97.9 F (36.6 C)  Resp: 18    GENERAL:alert, no distress, well nourished, well developed, comfortable, cooperative, obese and smiling SKIN: skin color, texture, turgor are normal, no rashes or significant lesions HEAD: Normocephalic, No masses, lesions, tenderness or abnormalities EYES: normal, PERRLA, EOMI, Conjunctiva are pink and non-injected EARS: External ears normal OROPHARYNX:lips, buccal mucosa, and tongue normal and mucous membranes are moist  NECK: supple, no adenopathy, thyroid normal size, non-tender, without nodularity, no stridor, non-tender, trachea midline LYMPH:  no palpable lymphadenopathy, no hepatosplenomegaly BREAST:not examined LUNGS:  clear to auscultation and percussion HEART: regular rate & rhythm, no murmurs, no gallops, S1 normal and S2 normal ABDOMEN:abdomen soft, non-tender, obese, normal bowel sounds, no masses or organomegaly and no hepatosplenomegaly BACK: Back symmetric, no curvature., No CVA tenderness EXTREMITIES:less then 2 second capillary refill, no joint deformities, effusion, or inflammation, no edema, no skin discoloration, no clubbing, no cyanosis  NEURO: alert & oriented x 3 with fluent speech, no focal motor/sensory deficits, gait normal    RADIOGRAPHIC STUDIES:  12/03/2013  CLINICAL DATA: 63 year old female with prior history of right  breast cancer post lumpectomy in 2010 followed by radiation therapy.  EXAM:  DIGITAL DIAGNOSTIC BILATERAL MAMMOGRAM WITH CAD  COMPARISON: Previous exams.  ACR Breast Density Category b: There are scattered areas of  fibroglandular density.  FINDINGS:  No suspicious masses or calcifications are seen in either breast.  Postsurgical changes are again seen in the right breast from prior  lumpectomy. A spot compression magnification tangential view of the  right breast lumpectomy site was performed. There is no mammographic  evidence of locally recurrent malignancy.  Mammographic images were processed with CAD.  IMPRESSION:  No mammographic evidence of malignancy in either breast.  RECOMMENDATION:  Diagnostic mammogram is suggested in 1 year. (Code:[id number]-B-01Y)  I  have discussed the findings and recommendations with the patient.  Results were also provided in writing at the conclusion of the  visit. If applicable, a reminder letter will be sent to the patient  regarding the next appointment.  BI-RADS CATEGORY 2: Benign.  Electronically Signed  By: Everlean Alstrom M.D.  On: 12/03/2013 16:44    ASSESSMENT:  1. 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 A core biopsy of the right breast and 11/13/2008 which showed DCIS. She  had lumpectomy on 12/10/2008 which showed invasive ductal carcinoma grade 2/3 that was 0.7 cm in greatest dimension. 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 Sentinel lymph node biopsy was negative for evidence of metastasis.  Tumor was ER positive PR negative HER-2/neu negative. She had repeat resection to clear margins on 12/26/2008 and this was reported as:  - ONE FOCUS OF INTERMEDIATE NUCLEAR GRADE DUCTAL CARCINOMA IN SITU, CLOSE TO THE INKED NEW DEEP MARGIN. - SKELETAL MUSCLE TISSUE PRESENT, NEGATIVE FOR CARCINOMA. She was therefore treated with postoperative radiation and then started on Arimidex which according to our records she is a scheduled to complete at the end of June 2015, but the patient thinks she started in September 2010. She therefore completed Arimidex therapy in September 2015 after 5 years of endocrine therapy.  2. Osteopenia of left femur with a T-score of -1.2. On Ca++ and Vit D. Next bone density is due in August 2016 and future exams will be deferred to primary care provider.  Patient Active Problem List   Diagnosis Date Noted  . Osteopenia 04/02/2014  . Invasive ductal carcinoma of right breast, stage 1 08/25/2011    PLAN:  1. I personally reviewed and went over laboratory results with the patient. The results are noted within this dictation.  2. I personally reviewed and went over radiographic studies with the patient. The results are noted within this dictation.  3. Next screening mammogram is due in June 2016  4. Next bone density exam is due in August 2016, and I will defer future bone density exams to primary care provider. 5. Continue Ca++ and Vit D for osteopenia 6. Labs today as planned: CBC diff, CMET 7. Patient education regarding survivorship 8. Review of NCCN guidelines pertaining to surveillance. 9. Return in 12 months for follow-up   THERAPY  PLAN:  She is to continue with annual screening mammogram. Additionally, she is to have a bone density in August 2016, and then we will defer to PCP for further follow-up. If she develops osteoporosis, I would recommend Prolia versus Reclast (annually). In the interim, we will perform surveillance per NCCN guidelines.  NCCN guidelines recommends the following surveillance for invasive breast cancer:  A. History and Physical exam every 4-6 months for 5 years and then every 12 months.  B. Mammography every 12 months  C. Women on Tamoxifen: annual gynecologic assessment every 12 months if uterus is present.  D. Women on aromatase inhibitor or who experience ovarian failure secondary to treatment should have monitoring of bone health with a bone mineral density determination at baseline and periodically thereafter.  E. Assess and encourage adherence to adjuvant endocrine therapy.  F. Evidence suggests that active lifestyle and achieving and maintaining an ideal body weight (20-25 BMI) may lead to optimal breast cancer outcomes.   All questions were answered. The patient knows to call the clinic with any problems, questions or concerns.  We can certainly see the patient much sooner if necessary.  Patient and plan discussed with Dr. Nelida Meuse and he is in agreement with the aforementioned.   KEFALAS,THOMAS 04/05/2014

## 2014-04-05 ENCOUNTER — Encounter (HOSPITAL_COMMUNITY): Payer: Self-pay | Admitting: Oncology

## 2014-04-05 ENCOUNTER — Encounter (HOSPITAL_COMMUNITY): Payer: BC Managed Care – PPO | Attending: Oncology | Admitting: Oncology

## 2014-04-05 VITALS — BP 140/78 | HR 86 | Temp 97.9°F | Resp 18 | Wt 211.0 lb

## 2014-04-05 DIAGNOSIS — Z23 Encounter for immunization: Secondary | ICD-10-CM

## 2014-04-05 DIAGNOSIS — C50911 Malignant neoplasm of unspecified site of right female breast: Secondary | ICD-10-CM | POA: Insufficient documentation

## 2014-04-05 DIAGNOSIS — C50511 Malignant neoplasm of lower-outer quadrant of right female breast: Secondary | ICD-10-CM

## 2014-04-05 DIAGNOSIS — M8580 Other specified disorders of bone density and structure, unspecified site: Secondary | ICD-10-CM | POA: Insufficient documentation

## 2014-04-05 DIAGNOSIS — Z170 Estrogen receptor positive status [ER+]: Secondary | ICD-10-CM

## 2014-04-05 LAB — COMPREHENSIVE METABOLIC PANEL
ALBUMIN: 4 g/dL (ref 3.5–5.2)
ALT: 40 U/L — AB (ref 0–35)
AST: 38 U/L — AB (ref 0–37)
Alkaline Phosphatase: 104 U/L (ref 39–117)
Anion gap: 14 (ref 5–15)
BUN: 16 mg/dL (ref 6–23)
CALCIUM: 10.2 mg/dL (ref 8.4–10.5)
CO2: 24 mEq/L (ref 19–32)
Chloride: 102 mEq/L (ref 96–112)
Creatinine, Ser: 0.7 mg/dL (ref 0.50–1.10)
GFR calc non Af Amer: >90 mL/min (ref >90)
Glucose, Bld: 103 mg/dL — ABNORMAL HIGH (ref 70–99)
Potassium: 4 mEq/L (ref 3.7–5.3)
SODIUM: 140 meq/L (ref 137–147)
TOTAL PROTEIN: 7.5 g/dL (ref 6.0–8.3)
Total Bilirubin: 0.3 mg/dL (ref 0.3–1.2)

## 2014-04-05 LAB — CBC WITH DIFFERENTIAL/PLATELET
BASOS ABS: 0.1 10*3/uL (ref 0.0–0.1)
BASOS PCT: 1 % (ref 0–1)
EOS ABS: 0.3 10*3/uL (ref 0.0–0.7)
EOS PCT: 5 % (ref 0–5)
HCT: 39.2 % (ref 36.0–46.0)
Hemoglobin: 13.7 g/dL (ref 12.0–15.0)
Lymphocytes Relative: 36 % (ref 12–46)
Lymphs Abs: 2 10*3/uL (ref 0.7–4.0)
MCH: 30.1 pg (ref 26.0–34.0)
MCHC: 34.9 g/dL (ref 30.0–36.0)
MCV: 86.2 fL (ref 78.0–100.0)
Monocytes Absolute: 0.5 10*3/uL (ref 0.1–1.0)
Monocytes Relative: 8 % (ref 3–12)
NEUTROS PCT: 50 % (ref 43–77)
Neutro Abs: 2.8 10*3/uL (ref 1.7–7.7)
PLATELETS: 296 10*3/uL (ref 150–400)
RBC: 4.55 MIL/uL (ref 3.87–5.11)
RDW: 13.6 % (ref 11.5–15.5)
WBC: 5.6 10*3/uL (ref 4.0–10.5)

## 2014-04-05 MED ORDER — INFLUENZA VAC SPLIT QUAD 0.5 ML IM SUSY
PREFILLED_SYRINGE | INTRAMUSCULAR | Status: AC
  Filled 2014-04-05: qty 0.5

## 2014-04-05 MED ORDER — INFLUENZA VAC SPLIT QUAD 0.5 ML IM SUSY
0.5000 mL | PREFILLED_SYRINGE | Freq: Once | INTRAMUSCULAR | Status: AC
  Administered 2014-04-05: 0.5 mL via INTRAMUSCULAR

## 2014-04-05 NOTE — Patient Instructions (Signed)
[facility] Discharge Instructions  RECOMMENDATIONS MADE BY THE CONSULTANT AND ANY TEST RESULTS WILL BE SENT TO YOUR REFERRING PHYSICIAN.  Bone scan as scheduled in August of next year. Follow-up here for office visit in one year.   Thank you for choosing [facility] to provide your oncology and hematology care.  To afford each patient quality time with our providers, please arrive at least 15 minutes before your scheduled appointment time.  With your help, our goal is to use those 15 minutes to complete the necessary work-up to ensure our physicians have the information they need to help with your evaluation and healthcare recommendations.    Effective January 1st, 2014, we ask that you re-schedule your appointment with our physicians should you arrive 10 or more minutes late for your appointment.  We strive to give you quality time with our providers, and arriving late affects you and other patients whose appointments are after yours.    Again, thank you for choosing [facility].  Our hope is that these requests will decrease the amount of time that you wait before being seen by our physicians.       _____________________________________________________________  Should you have questions after your visit to [facility], please contact our office at [phone] between the hours of 8:30 a.m. and 4:30 p.m.  Voicemails left after 4:30 p.m. will not be returned until the following business day.  For prescription refill requests, have your pharmacy contact our office with your prescription refill request.    _______________________________________________________________  We hope that we have given you very good care.  You may receive a patient satisfaction survey in the mail, please complete it and return it as soon as possible.  We value your feedback!  _______________________________________________________________  Have you asked  about our STAR program?  STAR stands for Survivorship Training and Rehabilitation, and this is a nationally recognized cancer care program that focuses on survivorship and rehabilitation.  Cancer and cancer treatments may cause problems, such as, pain, making you feel tired and keeping you from doing the things that you need or want to do. Cancer rehabilitation can help. Our goal is to reduce these troubling effects and help you have the best quality of life possible.  You may receive a survey from a nurse that asks questions about your current state of health.  Based on the survey results, all eligible patients will be referred to the [facility] program for an evaluation so we can better serve you!  A frequently asked questions sheet is available upon request.

## 2014-04-05 NOTE — Progress Notes (Signed)
Kelsey Andrews presents today for injection per the provider's orders.  Fluvarix administration without incident; see MAR for injection details.  Patient tolerated procedure well and without incident.  No questions or complaints noted at this time.  Kelsey Andrews presented for labwork.Labs per MD order drawn via Peripheral Line 23 gauge needle inserted in left upper forearm. Good blood return present. Procedure without incident. Needle removed intact. Patient tolerated procedure well.

## 2014-04-08 ENCOUNTER — Telehealth (HOSPITAL_COMMUNITY): Payer: Self-pay | Admitting: Emergency Medicine

## 2014-04-08 NOTE — Telephone Encounter (Signed)
Notified pt that blood work was good 

## 2014-11-26 ENCOUNTER — Other Ambulatory Visit: Payer: Self-pay | Admitting: Obstetrics & Gynecology

## 2014-11-26 DIAGNOSIS — C50911 Malignant neoplasm of unspecified site of right female breast: Secondary | ICD-10-CM

## 2014-11-26 DIAGNOSIS — Z9889 Other specified postprocedural states: Secondary | ICD-10-CM

## 2014-12-06 ENCOUNTER — Ambulatory Visit
Admission: RE | Admit: 2014-12-06 | Discharge: 2014-12-06 | Disposition: A | Discharge status: Home / Self Care | Payer: BC Managed Care – PPO | Source: Ambulatory Visit | Attending: Obstetrics & Gynecology | Admitting: Obstetrics & Gynecology

## 2014-12-06 DIAGNOSIS — C50911 Malignant neoplasm of unspecified site of right female breast: Secondary | ICD-10-CM

## 2014-12-06 DIAGNOSIS — Z9889 Other specified postprocedural states: Secondary | ICD-10-CM

## 2015-02-19 ENCOUNTER — Ambulatory Visit (HOSPITAL_COMMUNITY)
Admission: RE | Admit: 2015-02-19 | Discharge: 2015-02-19 | Disposition: A | Discharge status: Home / Self Care | Payer: BC Managed Care – PPO | Source: Ambulatory Visit | Attending: Oncology | Admitting: Oncology

## 2015-02-19 ENCOUNTER — Other Ambulatory Visit: Payer: Self-pay | Admitting: Obstetrics and Gynecology

## 2015-02-19 DIAGNOSIS — C50911 Malignant neoplasm of unspecified site of right female breast: Secondary | ICD-10-CM | POA: Insufficient documentation

## 2015-02-19 DIAGNOSIS — M8580 Other specified disorders of bone density and structure, unspecified site: Secondary | ICD-10-CM

## 2015-02-19 DIAGNOSIS — Z780 Asymptomatic menopausal state: Secondary | ICD-10-CM | POA: Diagnosis not present

## 2015-02-20 LAB — CYTOLOGY - PAP

## 2015-04-08 ENCOUNTER — Ambulatory Visit (HOSPITAL_COMMUNITY): Payer: BC Managed Care – PPO | Admitting: Oncology

## 2015-04-08 ENCOUNTER — Encounter (HOSPITAL_COMMUNITY): Payer: BC Managed Care – PPO | Attending: Oncology | Admitting: Oncology

## 2015-04-08 VITALS — BP 136/81 | HR 63 | Temp 97.9°F | Resp 18 | Wt 213.2 lb

## 2015-04-08 DIAGNOSIS — Z23 Encounter for immunization: Secondary | ICD-10-CM | POA: Diagnosis not present

## 2015-04-08 DIAGNOSIS — M8580 Other specified disorders of bone density and structure, unspecified site: Secondary | ICD-10-CM

## 2015-04-08 DIAGNOSIS — Z853 Personal history of malignant neoplasm of breast: Secondary | ICD-10-CM

## 2015-04-08 DIAGNOSIS — C50911 Malignant neoplasm of unspecified site of right female breast: Secondary | ICD-10-CM

## 2015-04-08 MED ORDER — INFLUENZA VAC SPLIT QUAD 0.5 ML IM SUSY
0.5000 mL | PREFILLED_SYRINGE | Freq: Once | INTRAMUSCULAR | Status: AC
Start: 2015-04-08 — End: 2015-04-08
  Administered 2015-04-08: 0.5 mL via INTRAMUSCULAR
  Filled 2015-04-08: qty 0.5

## 2015-04-08 NOTE — Progress Notes (Signed)
Conley Canal presents today for injection per MD orders. Fluarix administered IM in left Upper Arm. Administration without incident. Patient tolerated well.

## 2015-04-08 NOTE — Assessment & Plan Note (Addendum)
Bone density demonstrates a statistically significant decrease in bone density on August 2016 test.  She is currently on Ca++ and Vit D.  She is currently on 1200 mg of Calcium and 2000 IU of Vit D daily.  She is encouraged to continue this daily dosing.  She is encouraged to increase weight bearing exercise to help with her bone density.  She is not a candidate for more aggressive intervention at this time with a 0.9% 10 year risk of hip fracture and 12.3% major osteoporotic fracture.  Will defer further surveillance and management to primary care provider as she is no longer on any oncology treatment at this time.

## 2015-04-08 NOTE — Assessment & Plan Note (Addendum)
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 A core biopsy of the right breast and 11/13/2008 which showed DCIS. She had lumpectomy on 12/10/2008 which showed invasive ductal carcinoma grade 2/3 that was 0.7 cm in greatest dimension. 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 Sentinel lymph node biopsy was negative for evidence of metastasis.  Tumor was ER positive PR negative HER-2/neu negative. She had repeat resection to clear margins on 12/26/2008 and this was reported as:  - ONE FOCUS OF INTERMEDIATE NUCLEAR GRADE DUCTAL CARCINOMA IN SITU, CLOSE TO THE INKED NEW DEEP MARGIN. - SKELETAL MUSCLE TISSUE PRESENT, NEGATIVE FOR CARCINOMA. She was therefore treated with postoperative radiation and then started on Arimidex which according to our records she is a scheduled to complete at the end of June 2015, but the patient thinks she started in September 2010. She therefore completed Arimidex therapy in September 2015 after 5 years of endocrine therapy.  Oncology history is reviewed and up-to-date.  No role for labs from an oncology standpoint at this time.  She is changing primary care providers due to a family member working with her current primary care physician and there are family issues presently.  She is moving to primary care provider in [place].    Next mammogram is due in June 2017.   Return in 1 year for follow-up.  Influenza vaccine given today.

## 2015-04-08 NOTE — Progress Notes (Addendum)
Kelsey Noble, MD [address] [place] [address]  Invasive ductal carcinoma of right breast, stage 1 ([place])  Osteopenia  CURRENT THERAPY: Arimidex daily. Chart notes that she started Arimidex on 12/18/2008, but she notes that she started following completion of radiation in September. She therefore finished AI therapy in September 2015.   INTERVAL HISTORY: Kelsey Andrews 64 y.o. 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 A core biopsy of the right breast and 11/13/2008 which showed DCIS. She had lumpectomy on 12/10/2008 which showed invasive ductal carcinoma grade 2/3 that was 0.7 cm in greatest dimension. 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 Sentinel lymph node biopsy was negative for evidence of metastasis.  Tumor was ER positive PR negative HER-2/neu negative. She had repeat resection to clear margins on 12/26/2008 and this was reported as:  - ONE FOCUS OF INTERMEDIATE NUCLEAR GRADE DUCTAL CARCINOMA IN SITU, CLOSE TO THE INKED NEW DEEP MARGIN. - SKELETAL MUSCLE TISSUE PRESENT, NEGATIVE FOR CARCINOMA. She was therefore treated with postoperative radiation and then started on Arimidex which according to our records she is a scheduled to complete at the end of June 2015, but the patient thinks she started in September 2010. She will continue with Arimidex and finish 5 years worth of therapy in September 2015.     Invasive ductal carcinoma of right breast, stage 1 (HCC)   11/13/2008 Initial Diagnosis Core biopsy of right breast showing DCIS   12/10/2008 Surgery Lumpectomy of right breast- demonstrating invasive ductal carcinoma, 0.7 cm, grade II, with positive DCIS at the posterior marked margin.  Negative sentinel lymph node biopsy   12/26/2008 Surgery Repeat resection to clear  margins   01/30/2009 - 03/19/2009 Radiation Therapy Dr. Pablo Ledger   03/20/2009 - 03/27/2014 Chemotherapy Approximate start date of Arimidex.  Will complete 5 years worth of therapy in September 2015    I personally reviewed and went over laboratory results with the patient.  The results are noted within this dictation.  No role for labs from an oncology perspective at this time.  I personally reviewed and went over radiographic studies with the patient.  The results are noted within this dictation.  Mammogram in June 2016 was BIRADS 2.  She will be due for her next mammogram in June 2017.  She had her bone density exam in August 2016 and she is osteopenic with a decrease in bone density.  She does not meet parameters for treatment except for Ca++, Vit D, and weight bearing exercise based upon guidelines.  She denies any complaints today.  She is planning on changing primary care provider due to a family member working with Dr. Willey Blade and there are some family issues surrounding that particular family member.  As a result, Pam feels uncomfortable staying with Dr. Willey Blade at this time, but praises him as a physician.  She notes a right breast soreness at lumpectomy site and some redness that looks like a "hickey."  "I assure you that it is not a 'hickey.'"  On exam, these are benign changes associated with surgery and radiation.  See exam below.  Additionally, she notes dry mouth  Past Medical History  Diagnosis Date  . Thyroid activity decreased   . Breast cancer     rt.  . Melanoma of shoulder   . Adenomatous colon polyp   .  Invasive ductal carcinoma of right breast, stage 1 08/25/2011    Started Arimidex on 07/20/2008.  Stage I (T1b N0) intermediate grade invasive ductal carcinoma of the right breast with biopsy on 11/13/2008, re-excision on 12/25/2008 with no further invasive disease found. She did have some associated ductal carcinoma in situ close to the posterior margin and a little skeletal muscle  had to be resected for clear margins. Her invasive disease was ER positive at 10  . Osteopenia 04/02/2014    has Invasive ductal carcinoma of right breast, stage 1 (HCC) and Osteopenia on her problem list.     is allergic to morphine and related and penicillins.  We administered Influenza vac split quadrivalent PF.  Past Surgical History  Procedure Laterality Date  . Breast lumpectomy      rt.  . Tissue expander  removal w/ replacement of implant      rt. breast tissue removed  . Laparoscopic total hysterectomy    . Tonscellectomy    . Btl    . Removal of melanoma      Rt shoulder  . Carpel tunnel      both  . Colonoscopy      Denies any headaches, dizziness, double vision, fevers, chills, night sweats, nausea, vomiting, diarrhea, constipation, chest pain, heart palpitations, shortness of breath, blood in stool, black tarry stool, urinary pain, urinary burning, urinary frequency, hematuria.   PHYSICAL EXAMINATION  ECOG PERFORMANCE STATUS: 0 - Asymptomatic  Filed Vitals:   04/08/15 1009  BP: 136/81  Pulse: 63  Temp: 97.9 F (36.6 C)  Resp: 18    GENERAL:alert, no distress, well nourished, well developed, comfortable, cooperative, obese and smiling, unaccompanied today. SKIN: skin color, texture, turgor are normal, no rashes or significant lesions HEAD: Normocephalic, No masses, lesions, tenderness or abnormalities EYES: normal, PERRLA, EOMI, Conjunctiva are pink and non-injected EARS: External ears normal OROPHARYNX:lips, buccal mucosa, and tongue normal and mucous membranes are moist  NECK: supple, no adenopathy, thyroid normal size, non-tender, without nodularity, no stridor, non-tender, trachea midline LYMPH:  no palpable lymphadenopathy, no hepatosplenomegaly BREAST:Right lumpectomy site noted without any noted nipple/areolar changes, no masses or lesions, with lower outer quadrant telangiectasias.  Left breast without any masses, lesions, skin changes,  nipple/areolar changes. LUNGS: clear to auscultation and percussion HEART: regular rate & rhythm, no murmurs, no gallops, S1 normal and S2 normal ABDOMEN:abdomen soft, non-tender, obese, normal bowel sounds, no masses or organomegaly and no hepatosplenomegaly BACK: Back symmetric, no curvature., No CVA tenderness EXTREMITIES:less then 2 second capillary refill, no joint deformities, effusion, or inflammation, no edema, no skin discoloration, no clubbing, no cyanosis  NEURO: alert & oriented x 3 with fluent speech, no focal motor/sensory deficits, gait normal    RADIOGRAPHIC STUDIES:  12/03/2013  CLINICAL DATA: 64 year old female with prior history of right  breast cancer post lumpectomy in 2010 followed by radiation therapy.  EXAM:  DIGITAL DIAGNOSTIC BILATERAL MAMMOGRAM WITH CAD  COMPARISON: Previous exams.  ACR Breast Density Category b: There are scattered areas of  fibroglandular density.  FINDINGS:  No suspicious masses or calcifications are seen in either breast.  Postsurgical changes are again seen in the right breast from prior  lumpectomy. A spot compression magnification tangential view of the  right breast lumpectomy site was performed. There is no mammographic  evidence of locally recurrent malignancy.  Mammographic images were processed with CAD.  IMPRESSION:  No mammographic evidence of malignancy in either breast.  RECOMMENDATION:  Diagnostic mammogram is suggested in 1 year. (  Code:[id number]-B-01Y)  I have discussed the findings and recommendations with the patient.  Results were also provided in writing at the conclusion of the  visit. If applicable, a reminder letter will be sent to the patient  regarding the next appointment.  BI-RADS CATEGORY 2: Benign.  Electronically Signed  By: Everlean Alstrom M.D.  On: 12/03/2013 16:44    ASSESSMENT/PLAN:  Invasive ductal carcinoma of right breast, stage 1 (HCC) Stage I (T1BN0) invasive ductal carcinoma of the right breast,  intermediate grade. A core biopsy of the right breast and 11/13/2008 which showed DCIS. She had lumpectomy on 12/10/2008 which showed invasive ductal carcinoma grade 2/3 that was 0.7 cm in greatest dimension. 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 Sentinel lymph node biopsy was negative for evidence of metastasis.  Tumor was ER positive PR negative HER-2/neu negative. She had repeat resection to clear margins on 12/26/2008 and this was reported as:  - ONE FOCUS OF INTERMEDIATE NUCLEAR GRADE DUCTAL CARCINOMA IN SITU, CLOSE TO THE INKED NEW DEEP MARGIN. - SKELETAL MUSCLE TISSUE PRESENT, NEGATIVE FOR CARCINOMA. She was therefore treated with postoperative radiation and then started on Arimidex which according to our records she is a scheduled to complete at the end of June 2015, but the patient thinks she started in September 2010. She therefore completed Arimidex therapy in September 2015 after 5 years of endocrine therapy.  Oncology history is reviewed and up-to-date.  No role for labs from an oncology standpoint at this time.  She is changing primary care providers due to a family member working with her current primary care physician and there are family issues presently.  She is moving to primary care provider in [place].    Next mammogram is due in June 2017.   Return in 1 year for follow-up.  Influenza vaccine given today.       Osteopenia Bone density demonstrates a statistically significant decrease in bone density on August 2016 test.  She is currently on Ca++ and Vit D.  She is currently on 1200 mg of Calcium and 2000 IU of Vit D daily.  She is encouraged to continue this daily dosing.  She is encouraged to increase weight bearing exercise to help with her bone density.  She is not a candidate for more aggressive intervention at this time with a 0.9% 10 year risk of hip  fracture and 12.3% major osteoporotic fracture.  Will defer further surveillance and management to primary care provider as she is no longer on any oncology treatment at this time.    THERAPY PLAN:  She is to continue with annual screening mammogram. Additionally, she is to have a bone density followed by primary care provider. If she develops osteoporosis, I would recommend Prolia versus Reclast (annually). In the interim, we will perform surveillance per NCCN guidelines.  NCCN guidelines recommends the following surveillance for invasive breast cancer:  A. History and Physical exam every 4-6 months for 5 years and then every 12 months.  B. Mammography every 12 months  C. Women on Tamoxifen: annual gynecologic assessment every 12 months if uterus is present.  D. Women on aromatase inhibitor or who experience ovarian failure secondary to treatment should have monitoring of bone health with a bone mineral density determination at baseline and periodically thereafter.  E. Assess and encourage adherence to adjuvant endocrine therapy.  F. Evidence suggests that active lifestyle and achieving and maintaining an ideal body  weight (20-25 BMI) may lead to optimal breast cancer outcomes.   All questions were answered. The patient knows to call the clinic with any problems, questions or concerns. We can certainly see the patient much sooner if necessary.  Patient and plan discussed with Dr. Ancil Linsey and she is in agreement with the aforementioned.   KEFALAS,THOMAS 04/08/2015 10:54 AM

## 2015-04-08 NOTE — Patient Instructions (Signed)
[place] at [facility] Discharge Instructions  RECOMMENDATIONS MADE BY THE CONSULTANT AND ANY TEST RESULTS WILL BE SENT TO YOUR REFERRING PHYSICIAN.  Exam per Kirby Crigler, PA. Return to clinic for follow-up in 1 year. Report any issues/concerns to clinic as needed.  Thank you for choosing [place] at [facility] to provide your oncology and hematology care.  To afford each patient quality time with our provider, please arrive at least 15 minutes before your scheduled appointment time.    You need to re-schedule your appointment should you arrive 10 or more minutes late.  We strive to give you quality time with our providers, and arriving late affects you and other patients whose appointments are after yours.  Also, if you no show three or more times for appointments you may be dismissed from the clinic at the providers discretion.     Again, thank you for choosing [facility].  Our hope is that these requests will decrease the amount of time that you wait before being seen by our physicians.       _____________________________________________________________  Should you have questions after your visit to [facility], please contact our office at [phone] between the hours of 8:30 a.m. and 4:30 p.m.  Voicemails left after 4:30 p.m. will not be returned until the following business day.  For prescription refill requests, have your pharmacy contact our office.

## 2015-11-03 ENCOUNTER — Other Ambulatory Visit: Payer: Self-pay

## 2015-11-03 DIAGNOSIS — Z9889 Other specified postprocedural states: Secondary | ICD-10-CM

## 2015-12-04 ENCOUNTER — Other Ambulatory Visit: Payer: Self-pay | Admitting: Obstetrics and Gynecology

## 2015-12-04 DIAGNOSIS — Z9889 Other specified postprocedural states: Secondary | ICD-10-CM

## 2015-12-08 ENCOUNTER — Ambulatory Visit
Admission: RE | Admit: 2015-12-08 | Discharge: 2015-12-08 | Disposition: A | Discharge status: Home / Self Care | Payer: BC Managed Care – PPO | Source: Ambulatory Visit

## 2015-12-08 DIAGNOSIS — Z9889 Other specified postprocedural states: Secondary | ICD-10-CM

## 2016-04-08 ENCOUNTER — Encounter (HOSPITAL_COMMUNITY): Payer: Self-pay | Admitting: Hematology & Oncology

## 2016-04-08 ENCOUNTER — Encounter (HOSPITAL_COMMUNITY): Payer: BC Managed Care – PPO | Attending: Hematology & Oncology | Admitting: Hematology & Oncology

## 2016-04-08 VITALS — BP 120/72 | HR 65 | Temp 98.2°F | Resp 16 | Wt 207.8 lb

## 2016-04-08 DIAGNOSIS — Z0000 Encounter for general adult medical examination without abnormal findings: Secondary | ICD-10-CM

## 2016-04-08 DIAGNOSIS — M8580 Other specified disorders of bone density and structure, unspecified site: Secondary | ICD-10-CM | POA: Diagnosis not present

## 2016-04-08 DIAGNOSIS — Z23 Encounter for immunization: Secondary | ICD-10-CM | POA: Diagnosis not present

## 2016-04-08 DIAGNOSIS — Z170 Estrogen receptor positive status [ER+]: Secondary | ICD-10-CM

## 2016-04-08 DIAGNOSIS — C50911 Malignant neoplasm of unspecified site of right female breast: Secondary | ICD-10-CM

## 2016-04-08 DIAGNOSIS — M8589 Other specified disorders of bone density and structure, multiple sites: Secondary | ICD-10-CM

## 2016-04-08 MED ORDER — INFLUENZA VAC SPLIT QUAD 0.5 ML IM SUSY
0.5000 mL | PREFILLED_SYRINGE | Freq: Once | INTRAMUSCULAR | Status: AC
  Administered 2016-04-08: 0.5 mL via INTRAMUSCULAR
  Filled 2016-04-08: qty 0.5

## 2016-04-08 NOTE — Patient Instructions (Addendum)
[place] at [facility] Discharge Instructions  RECOMMENDATIONS MADE BY THE CONSULTANT AND ANY TEST RESULTS WILL BE SENT TO YOUR REFERRING PHYSICIAN.  You saw Dr.Penland today. Flu shot given today.  Follow up in 1 year. Get mammogram in June.   Thank you for choosing [place] at [facility] to provide your oncology and hematology care.  To afford each patient quality time with our provider, please arrive at least 15 minutes before your scheduled appointment time.   Beginning January 23rd 2017 lab work for the [facility] will be done in the  Main lab at [facility] on 1st floor. If you have a lab appointment with the [facility] please come in thru the  Main Entrance and check in at the main information desk  You need to re-schedule your appointment should you arrive 10 or more minutes late.  We strive to give you quality time with our providers, and arriving late affects you and other patients whose appointments are after yours.  Also, if you no show three or more times for appointments you may be dismissed from the clinic at the providers discretion.     Again, thank you for choosing [facility].  Our hope is that these requests will decrease the amount of time that you wait before being seen by our physicians.       _____________________________________________________________  Should you have questions after your visit to [facility], please contact our office at [phone] between the hours of 8:30 a.m. and 4:30 p.m.  Voicemails left after 4:30 p.m. will not be returned until the following business day.  For prescription refill requests, have your pharmacy contact our office.         Resources For Cancer Patients and their Caregivers ? American Cancer Society: Can assist with transportation, wigs, general needs, runs Look Good Feel Better.        (906)872-4491 ? Cancer Care: Provides financial  assistance, online support groups, medication/co-pay assistance.  1-800-813-HOPE (402) 070-6417) ? [facility] Assists [place] cancer patients and their families through emotional , educational and financial support.  816-443-9533 ? [place] DSS Where to apply for food stamps, Medicaid and utility assistance. (512)847-4740 ? [facility]: Transportation to medical appointments. (585) 815-1688 ? Social Security Administration: May apply for disability if have a Stage IV cancer. 979-512-2216 762 482 9291 ? [employer], Disability and Transit Services: Assists with nutrition, care and transit needs. [place] Support Programs: @10RELATIVEDAYS @ > Cancer Support Group  2nd Tuesday of the month 1pm-2pm, Journey Room  > Creative Journey  3rd Tuesday of the month 1130am-1pm, Journey Room  > Look Good Feel Better  1st Wednesday of the month 10am-12 noon, Journey Room (Call [place] to register [phone])

## 2016-04-08 NOTE — Progress Notes (Signed)
Pt given flu shot in left deltoid. Pt tolerated well. Pt stable and discharged home ambulatory.  

## 2016-04-08 NOTE — Progress Notes (Signed)
Kelsey Noble, MD [address] [place] [address]  Preventative health care - Plan: CONTRAVE 8-90 MG TB12, ALPRAZolam (XANAX) 0.5 MG tablet, Influenza vac split quadrivalent PF (FLUARIX) injection 0.5 mL, MM SCREENING BREAST TOMO BILATERAL  Invasive ductal carcinoma of right breast, stage 1 (HCC) - Plan: CONTRAVE 8-90 MG TB12, ALPRAZolam (XANAX) 0.5 MG tablet, Influenza vac split quadrivalent PF (FLUARIX) injection 0.5 mL, MM SCREENING BREAST TOMO BILATERAL  CURRENT THERAPY: Arimidex daily. Chart notes that she started Arimidex on 12/18/2008, but she notes that she started following completion of radiation in September. She therefore finished AI therapy in September 2015.   INTERVAL HISTORY: Kelsey Andrews 65 y.o. 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 A core biopsy of the right breast and 11/13/2008 which showed DCIS. She had lumpectomy on 12/10/2008 which showed invasive ductal carcinoma grade 2/3 that was 0.7 cm in greatest dimension. 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 Sentinel lymph node biopsy was negative for evidence of metastasis.  Tumor was ER positive PR negative HER-2/neu negative. She had repeat resection to clear margins on 12/26/2008 and this was reported as:  - ONE FOCUS OF INTERMEDIATE NUCLEAR GRADE DUCTAL CARCINOMA IN SITU, CLOSE TO THE INKED NEW DEEP MARGIN. - SKELETAL MUSCLE TISSUE PRESENT, NEGATIVE FOR CARCINOMA. She was therefore treated with postoperative radiation and then started on Arimidex which according to our records she is a scheduled to complete at the end of June 2015, but the patient thinks she started in September 2010. She finished in September 2015.     Invasive ductal carcinoma of right breast, stage 1 (HCC)   11/13/2008 Initial  Diagnosis    Core biopsy of right breast showing DCIS      12/10/2008 Surgery    Lumpectomy of right breast- demonstrating invasive ductal carcinoma, 0.7 cm, grade II, with positive DCIS at the posterior marked margin.  Negative sentinel lymph node biopsy      12/26/2008 Surgery    Repeat resection to clear margins      01/30/2009 - 03/19/2009 Radiation Therapy    Dr. Pablo Ledger      03/20/2009 - 03/27/2014 Chemotherapy    Approximate start date of Arimidex.  Will complete 5 years worth of therapy in September 2015      Patient feels well and reports a normal appetite. She states she sometimes feels fatigued, but believes this is a combination of her weight and age.   She was diagnosed with sjogren's syndrome. She notes that her dry mouth and dry eyes are very uncomfortable but she has found ways to deal with these.  Her last mammogram was in June. She is up to date on other well care. She denies any problems with her breasts.   Patient lost her mom in June from a stroke. This is difficult for her.   Patient has not had a flu shot this year Appetite is good. Energy is baseline and unchanged.    Past Medical History:  Diagnosis Date  . Adenomatous colon polyp   . Breast cancer ([place])    rt.  . Invasive ductal carcinoma of right breast, stage 1 ([place]) 08/25/2011   Started Arimidex on 07/20/2008.  Stage I (T1b N0) intermediate grade invasive ductal carcinoma of the right breast with biopsy on 11/13/2008, re-excision on 12/25/2008  with no further invasive disease found. 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 Her invasive disease was ER positive at 10  . Melanoma of shoulder ([place] [place])   . Osteopenia 04/02/2014  . Thyroid activity decreased     has Invasive ductal carcinoma of right breast, stage 1 (HCC) and Osteopenia on her problem list.     is allergic to morphine and related and  penicillins.  We administered Influenza vac split quadrivalent PF.  Past Surgical History:  Procedure Laterality Date  . BREAST LUMPECTOMY     rt.  . BTL    . carpel tunnel     both  . COLONOSCOPY    . LAPAROSCOPIC TOTAL HYSTERECTOMY    . removal of melanoma     Rt shoulder  . TISSUE EXPANDER  REMOVAL W/ REPLACEMENT OF IMPLANT     rt. breast tissue removed  . tonscellectomy      Positive for dry mouth and fatigue. Denies any headaches, dizziness, double vision, fevers, chills, night sweats, nausea, vomiting, diarrhea, constipation, chest pain, heart palpitations, shortness of breath, blood in stool, black tarry stool, urinary pain, urinary burning, urinary frequency, hematuria. Dry mouth due to Sjogren's syndrome Occasional fatigue 14 point review of systems was performed and is negative except as detailed under history of present illness and above   PHYSICAL EXAMINATION  ECOG PERFORMANCE STATUS: 0 - Asymptomatic  Vitals:   04/08/16 1630  BP: 120/72  Pulse: 65  Resp: 16  Temp: 98.2 F (36.8 C)    GENERAL:alert, no distress, well nourished, well developed, comfortable, cooperative, obese and smiling SKIN: skin color, texture, turgor are normal, no rashes or significant lesions HEAD: Normocephalic, No masses, lesions, tenderness or abnormalities EYES: normal, PERRLA, EOMI, Conjunctiva are pink and non-injected EARS: External ears normal OROPHARYNX:lips, buccal mucosa, and tongue normal and mucous membranes are moist  NECK: supple, no adenopathy, thyroid normal size, non-tender, without nodularity, no stridor, non-tender, trachea midline LYMPH:  no palpable lymphadenopathy, no hepatosplenomegaly BREAST: Right breast vertical incision at 7 oclock position that extends to nipples. Telangiectasia around inferior right breast, secondary to XRT LUNGS: clear to auscultation and percussion HEART: arrhythmia, no murmurs, no gallops, S1 normal and S2 normal ABDOMEN:abdomen  soft, non-tender, obese, normal bowel sounds, no masses or organomegaly and no hepatosplenomegaly BACK: Back symmetric, no curvature., No CVA tenderness EXTREMITIES:less then 2 second capillary refill, no joint deformities, effusion, or inflammation, no edema, no skin discoloration, no clubbing, no cyanosis  NEURO: alert & oriented x 3 with fluent speech, no focal motor/sensory deficits, gait normal   RADIOGRAPHIC STUDIES:  CLINICAL DATA:  65 year old female presenting for routine postlumpectomy follow-up status post right breast lumpectomy in June of 2010.  EXAM: 2D DIGITAL DIAGNOSTIC BILATERAL MAMMOGRAM WITH CAD AND ADJUNCT TOMO  COMPARISON:  Previous exam(s).  ACR Breast Density Category b: There are scattered areas of fibroglandular density.  FINDINGS: Stable right breast lumpectomy site. No suspicious calcifications, masses or areas of distortion are seen in the bilateral breasts.  Mammographic images were processed with CAD.  IMPRESSION: Stable right breast lumpectomy site. No mammographic evidence of malignancy in the bilateral breasts.  RECOMMENDATION: Screening mammogram in one year.(Code:[id number]-[id number])  I have discussed the findings and recommendations with the patient. Results were also provided in writing at the conclusion of the visit. If applicable, a reminder letter will be sent to the patient regarding the next appointment.  BI-RADS CATEGORY  2: Benign.   Electronically Signed   By: Ammie Ferrier M.D.   On: 12/08/2015 13:46    ASSESSMENT:  1. 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 A core biopsy of the right breast and 11/13/2008 which showed DCIS. She had lumpectomy on 12/10/2008 which showed invasive ductal carcinoma grade 2/3 that was 0.7 cm in greatest dimension. Report also noted extensive intraductal carcinoma intermediate grade and ductal carcinoma in situ was focally positive in the posterior  inked margin. Sentinel lymph node biopsy was negative for evidence of metastasis.  Tumor was ER positive PR negative HER-2/neu negative. She had repeat resection to clear margins on 12/26/2008 and this was reported as:  - ONE FOCUS OF INTERMEDIATE NUCLEAR GRADE DUCTAL CARCINOMA IN SITU, CLOSE TO THE INKED NEW DEEP MARGIN. - SKELETAL MUSCLE TISSUE PRESENT, NEGATIVE FOR CARCINOMA. She was therefore treated with postoperative radiation and then started on Arimidex which according to our records she is a scheduled to complete at the end of June 2015, but the patient thinks she started in September 2010. She therefore completed Arimidex therapy in September 2015 after 5 years of endocrine therapy.  2. Osteopenia of left femur with a T-score of -1.2. On Ca++ and Vit D.   Patient Active Problem List   Diagnosis Date Noted  . Osteopenia 04/02/2014  . Invasive ductal carcinoma of right breast, stage 1 ([place]) 08/25/2011    PLAN:  1. Next screening mammogram is due in June 2018, Breast exam performed today 2. I will defer future bone density exams to primary care provider. 3. Continue Ca++ and Vit D for osteopenia 4. Patient education regarding survivorship 5. Review of NCCN guidelines pertaining to surveillance. 6. Return in 12 months for follow-up   THERAPY PLAN:  She is to continue with annual screening mammogram. In the interim, we will perform surveillance per NCCN guidelines.  NCCN guidelines recommends the following surveillance for invasive breast cancer:  A. History and Physical exam every 4-6 months for 5 years and then every 12 months.  B. Mammography every 12 months  C. Women on Tamoxifen: annual gynecologic assessment every 12 months if uterus is present.  D. Women on aromatase inhibitor or who experience ovarian failure secondary to treatment should have monitoring of bone health with a bone mineral density determination at baseline and periodically thereafter.  E. Assess and encourage  adherence to adjuvant endocrine therapy.  F. Evidence suggests that active lifestyle and achieving and maintaining an ideal body weight (20-25 BMI) may lead to optimal breast cancer outcomes.  Harmonie received the flu shot today.   Follow up with patient in one year.  All questions were answered. The patient knows to call the clinic with any problems, questions or concerns. We can certainly see the patient much sooner if necessary.  This document serves as a record of services personally performed by Ancil Linsey, MD. It was created on her behalf by Elmyra Ricks, a trained medical scribe. The creation of this record is based on the scribe's personal observations and the provider's statements to them. This document has been checked and approved by the attending provider.  I have reviewed the above documentation for accuracy and completeness and I agree with the above.  Molli Hazard, MD  04/08/2016

## 2016-04-09 ENCOUNTER — Ambulatory Visit (HOSPITAL_COMMUNITY): Payer: BC Managed Care – PPO | Admitting: Hematology & Oncology

## 2016-04-09 ENCOUNTER — Other Ambulatory Visit (HOSPITAL_COMMUNITY): Payer: Self-pay | Admitting: Hematology & Oncology

## 2016-04-09 DIAGNOSIS — Z1231 Encounter for screening mammogram for malignant neoplasm of breast: Secondary | ICD-10-CM

## 2016-05-02 ENCOUNTER — Encounter (HOSPITAL_COMMUNITY): Payer: Self-pay | Admitting: Hematology & Oncology

## 2016-07-21 ENCOUNTER — Other Ambulatory Visit (HOSPITAL_COMMUNITY): Payer: Self-pay | Admitting: Surgery

## 2016-07-28 ENCOUNTER — Encounter (HOSPITAL_COMMUNITY)
Admission: RE | Admit: 2016-07-28 | Discharge: 2016-07-28 | Disposition: A | Discharge status: Home / Self Care | Payer: BC Managed Care – PPO | Source: Ambulatory Visit | Attending: Surgery | Admitting: Surgery

## 2016-07-28 MED ORDER — TECHNETIUM TC 99M SESTAMIBI GENERIC - CARDIOLITE
25.8000 | Freq: Once | INTRAVENOUS | Status: AC | PRN
  Administered 2016-07-28: 25.8 via INTRAVENOUS

## 2016-08-04 ENCOUNTER — Ambulatory Visit: Payer: Self-pay | Admitting: Surgery

## 2016-08-19 NOTE — Patient Instructions (Addendum)
Kelsey Andrews  08/19/2016   Your procedure is scheduled on: 09-03-16  Report to [facility] Main  Entrance take [facility]  elevators to 3rd floor to  [name] at  530 AM.  Call this number if you have problems the morning of surgery [phone]   Remember: ONLY 1 PERSON MAY GO WITH YOU TO SHORT STAY TO GET  READY MORNING OF [place].  Do not eat food or drink liquids :After Midnight.     Take these medicines the morning of surgery with A SIP OF WATER: ALPRAZOLAM(XANAX) IF NEEDED               You may not have any metal on your body including hair pins and              piercings  Do not wear jewelry, make-up, lotions, powders or perfumes, deodorant             Do not wear nail polish.  Do not shave  48 hours prior to surgery.              Men may shave face and neck.   Do not bring valuables to the hospital. [place].  Contacts, dentures or bridgework may not be worn into surgery.  Leave suitcase in the car. After surgery it may be brought to your room.     Patients discharged the day of surgery will not be allowed to drive home.  Name and phone number of your driver: Kelsey Andrews  Special Instructions: N/A              Please read over the following fact sheets you were given: _____________________________________________________________________             [facility] - Preparing for Surgery Before surgery, you can play an important role.  Because skin is not sterile, your skin needs to be as free of germs as possible.  You can reduce the number of germs on your skin by washing with CHG (chlorahexidine gluconate) soap before surgery.  CHG is an antiseptic cleaner which kills germs and bonds with the skin to continue killing germs even after washing. Please DO NOT use if you have an allergy to CHG or antibacterial soaps.  If your skin becomes reddened/irritated stop using  the CHG and inform your nurse when you arrive at Short Stay. Do not shave (including legs and underarms) for at least 48 hours prior to the first CHG shower.  You may shave your face/neck. Please follow these instructions carefully:  1.  Shower with CHG Soap the night before surgery and the  morning of Surgery.  2.  If you choose to wash your hair, wash your hair first as usual with your  normal  shampoo.  3.  After you shampoo, rinse your hair and body thoroughly to remove the  shampoo.                           4.  Use CHG as you would any other liquid soap.  You can apply chg directly  to the skin and wash  Gently with a scrungie or clean washcloth.  5.  Apply the CHG Soap to your body ONLY FROM THE NECK DOWN.   Do not use on face/ open                           Wound or open sores. Avoid contact with eyes, ears mouth and genitals (private parts).                       Wash face,  Genitals (private parts) with your normal soap.             6.  Wash thoroughly, paying special attention to the area where your surgery  will be performed.  7.  Thoroughly rinse your body with warm water from the neck down.  8.  DO NOT shower/wash with your normal soap after using and rinsing off  the CHG Soap.                9.  Pat yourself dry with a clean towel.            10.  Wear clean pajamas.            11.  Place clean sheets on your bed the night of your first shower and do not  sleep with pets. Day of Surgery : Do not apply any lotions/deodorants the morning of surgery.  Please wear clean clothes to the hospital/surgery center.  FAILURE TO FOLLOW THESE INSTRUCTIONS MAY RESULT IN THE CANCELLATION OF YOUR SURGERY PATIENT SIGNATURE_________________________________  NURSE SIGNATURE__________________________________  ________________________________________________________________________

## 2016-08-23 ENCOUNTER — Encounter (HOSPITAL_COMMUNITY): Payer: Self-pay

## 2016-08-23 ENCOUNTER — Encounter (HOSPITAL_COMMUNITY)
Admission: RE | Admit: 2016-08-23 | Discharge: 2016-08-23 | Disposition: A | Discharge status: Home / Self Care | Payer: BC Managed Care – PPO | Source: Ambulatory Visit | Attending: Surgery | Admitting: Surgery

## 2016-08-23 DIAGNOSIS — Z01812 Encounter for preprocedural laboratory examination: Secondary | ICD-10-CM | POA: Diagnosis present

## 2016-08-23 DIAGNOSIS — E213 Hyperparathyroidism, unspecified: Secondary | ICD-10-CM | POA: Insufficient documentation

## 2016-08-23 HISTORY — DX: Unspecified osteoarthritis, unspecified site: M19.90

## 2016-08-23 HISTORY — DX: Primary hyperparathyroidism: E21.0

## 2016-08-23 LAB — BASIC METABOLIC PANEL
Anion gap: 7 (ref 5–15)
BUN: 15 mg/dL (ref 6–20)
CALCIUM: 10.4 mg/dL — AB (ref 8.9–10.3)
CO2: 27 mmol/L (ref 22–32)
CREATININE: 0.71 mg/dL (ref 0.44–1.00)
Chloride: 107 mmol/L (ref 101–111)
GFR calc Af Amer: >60 mL/min (ref >60)
GLUCOSE: 134 mg/dL — AB (ref 65–99)
Potassium: 4.2 mmol/L (ref 3.5–5.1)
Sodium: 141 mmol/L (ref 135–145)

## 2016-08-23 LAB — CBC
HCT: 41 % (ref 36.0–46.0)
Hemoglobin: 14.2 g/dL (ref 12.0–15.0)
MCH: 29.7 pg (ref 26.0–34.0)
MCHC: 34.6 g/dL (ref 30.0–36.0)
MCV: 85.8 fL (ref 78.0–100.0)
PLATELETS: 302 10*3/uL (ref 150–400)
RBC: 4.78 MIL/uL (ref 3.87–5.11)
RDW: 13.5 % (ref 11.5–15.5)
WBC: 8.1 10*3/uL (ref 4.0–10.5)

## 2016-09-01 ENCOUNTER — Encounter (HOSPITAL_COMMUNITY): Payer: Self-pay | Admitting: Surgery

## 2016-09-01 DIAGNOSIS — E210 Primary hyperparathyroidism: Secondary | ICD-10-CM | POA: Diagnosis present

## 2016-09-01 NOTE — H&P (Signed)
General Surgery [facility]  Conley Canal DOB: [birth year] Married / Language: English / Race: White Female   History of Present Illness  The patient is a 66 year old female who presents with a parathyroid neoplasm.  Patient is referred by Dr. Crist Infante for evaluation and management of hypercalcemia and suspected primary hyperparathyroidism. Patient was noted on routine laboratory studies to have an elevated serum calcium level of 11.4. Additional laboratory studies included a vitamin D level which was normal at 54 and an intact PTH level which was normal at 31. Patient underwent 24 urine collection for calcium which was markedly elevated at 451. Patient complains of urinary frequency. She denies nephrolithiasis. She notes a history of osteopenia. Patient does have hypothyroidism and is taking Synthroid. She has had no prior head or neck surgery. Patient does have a history of breast cancer and underwent partial mastectomy by Dr. Autumn Messing in 2010. Family history is notable for thyroid disease and the patient's sister. I did her surgery. There is no other family history of endocrine neoplasms. Patient did have a thyroid ultrasound in 2014 which was a benign study. Patient has not had any additional imaging studies recently.   Past Surgical History Breast Biopsy  Right. Breast Mass; Local Excision  Right. Colon Polyp Removal - Colonoscopy  Hysterectomy (not due to cancer) - Complete  Oral Surgery  Tonsillectomy   Diagnostic Studies History Colonoscopy  5-10 years ago Mammogram  within last year Pap Smear  1-5 years ago  Allergies  Morphine Sulfate (PF) *ANALGESICS - OPIOID*  Penicillins   Medication History ALPRAZolam (0.5MG  Tablet, Oral) Active. Atorvastatin Calcium (10MG  Tablet, Oral) Active. Contrave (8-90MG  Tablet ER 12HR, Oral) Active. HydrOXYzine HCl (10MG  Tablet, Oral) Active. Levothyroxine Sodium (137MCG Tablet, Oral)  Active. Vitamin D (2000UNIT Tablet, Oral) Active. Vitamin B Complex (Oral) Active. Multivitamin Adult (Oral) Active. Aspirin (81MG  Tablet DR, Oral) Active. Probiotic (Oral) Active. Fish Oil Burp-Less (1000MG  Capsule, Oral) Active. Medications Reconciled  Social History  Caffeine use  Carbonated beverages, Coffee, Tea. No alcohol use  No drug use  Tobacco use  Never smoker.  Family History Alcohol Abuse  Father. Arthritis  Brother, Mother, Sister. Cerebrovascular Accident  Brother, Sister. Hypertension  Sister. Thyroid problems  Mother, Sister.  Pregnancy / Birth History Age at menarche  61 years. Age of menopause  36-50 Gravida  2 Maternal age  88-25 Para  2  Other Problems Arthritis  Breast Cancer  Hypercholesterolemia  Melanoma  Oophorectomy  Bilateral. Thyroid Disease   Review of Systems  General Present- Fatigue. Not Present- Appetite Loss, Chills, Fever, Night Sweats, Weight Gain and Weight Loss. Skin Present- Dryness. Not Present- Change in Wart/Mole, Hives, Jaundice, New Lesions, Non-Healing Wounds, Rash and Ulcer. HEENT Present- Ringing in the Ears, Seasonal Allergies and Wears glasses/contact lenses. Not Present- Earache, Hearing Loss, Hoarseness, Nose Bleed, Oral Ulcers, Sinus Pain, Sore Throat, Visual Disturbances and Yellow Eyes. Respiratory Present- Snoring. Not Present- Bloody sputum, Chronic Cough, Difficulty Breathing and Wheezing. Breast Not Present- Breast Mass, Breast Pain, Nipple Discharge and Skin Changes. Cardiovascular Present- Palpitations. Not Present- Chest Pain, Difficulty Breathing Lying Down, Leg Cramps, Rapid Heart Rate, Shortness of Breath and Swelling of Extremities. Gastrointestinal Not Present- Abdominal Pain, Bloating, Bloody Stool, Change in Bowel Habits, Chronic diarrhea, Constipation, Difficulty Swallowing, Excessive gas, Gets full quickly at meals, Hemorrhoids, Indigestion, Nausea, Rectal Pain and  Vomiting. Female Genitourinary Present- Frequency and Nocturia. Not Present- Painful Urination, Pelvic Pain and Urgency. Musculoskeletal Not Present- Back  Pain, Joint Pain, Joint Stiffness, Muscle Pain, Muscle Weakness and Swelling of Extremities. Neurological Present- Headaches. Not Present- Decreased Memory, Fainting, Numbness, Seizures, Tingling, Tremor, Trouble walking and Weakness. Psychiatric Not Present- Anxiety, Bipolar, Change in Sleep Pattern, Depression, Fearful and Frequent crying. Endocrine Present- Hot flashes. Not Present- Cold Intolerance, Excessive Hunger, Hair Changes, Heat Intolerance and New Diabetes. Hematology Not Present- Blood Thinners, Easy Bruising, Excessive bleeding, Gland problems, HIV and Persistent Infections.  Vitals Weight: 205.6 lb Height: 65.5in Body Surface Area: 2.01 m Body Mass Index: 33.69 kg/m  Temp.: 99.77F(Oral)  Pulse: 69 (Regular)  BP: 118/72 (Sitting, Left Arm, Standard)  Physical Exam   The physical exam findings are as follows: Note:General - appears comfortable, no distress; not diaphorectic  HEENT - normocephalic; sclerae clear, gaze conjugate; mucous membranes moist, dentition good; voice normal  Neck - symmetric on extension; no palpable anterior or posterior cervical adenopathy; no palpable masses in the thyroid bed  Chest - clear bilaterally without rhonchi, rales, or wheeze  Cor - regular rhythm with normal rate; no significant murmur  Ext - non-tender without significant edema or lymphedema  Neuro - grossly intact; no tremor   Assessment & Plan   HYPERCALCEMIA (E83.52)  Follow Up - Call [facility] office after tests / studies doneto discuss further plans  Patient presents on referral from her primary care physician for evaluation of hypercalcemia and possible primary hyperparathyroidism. Patient is given written literature on parathyroid surgery to review at home.  The patient and I reviewed her history. We  reviewed her laboratory studies. We discussed her thyroid ultrasound from 2014. Patient has symptoms of urinary frequency and is found to have osteopenia on bone density scanning. I have recommended proceeding with nuclear medicine parathyroid scan in hopes of confirming the diagnosis of primary hyperparathyroidism and localizing a parathyroid adenoma. If the scan is positive, she will be a good candidate for minimally invasive surgery. If the scan is negative, we will perform further evaluation with ultrasound examination of the neck, and if needed, a 4D CT scan. While it is likely that the patient has hyperparathyroidism, there may be another etiology for her hypercalcemia. If all studies are negative, we will consider referral to endocrinology for further evaluation and recommendations. Patient understands and agrees with this plan.  We will arrange for nuclear medicine parathyroid scan. We will contact the patient with the results when they are available.  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  Will plan minimally invasive surgery.  The risks and benefits of the procedure have been discussed at length with the patient.  The patient understands the proposed procedure, potential alternative treatments, and the course of recovery to be expected.  All of the patient's questions have been answered at this time.  The patient wishes to proceed with surgery.  Earnstine Regal, MD, [facility] Office: [phone]

## 2016-09-02 NOTE — Anesthesia Preprocedure Evaluation (Signed)
Anesthesia Evaluation  Patient identified by MRN, date of birth, ID band Patient awake    Reviewed: Allergy & Precautions, NPO status , Patient's Chart, lab work & pertinent test results  Airway Mallampati: II  TM Distance: >3 FB Neck ROM: Full    Dental no notable dental hx.    Pulmonary neg pulmonary ROS,    Pulmonary exam normal breath sounds clear to auscultation       Cardiovascular negative cardio ROS Normal cardiovascular exam Rhythm:Regular Rate:Normal     Neuro/Psych negative neurological ROS  negative psych ROS   GI/Hepatic negative GI ROS, Neg liver ROS,   Endo/Other  negative endocrine ROS  Renal/GU negative Renal ROS  negative genitourinary   Musculoskeletal negative musculoskeletal ROS (+)   Abdominal   Peds negative pediatric ROS (+)  Hematology negative hematology ROS (+)   Anesthesia Other Findings   Reproductive/Obstetrics negative OB ROS                             Anesthesia Physical Anesthesia Plan  ASA: II  Anesthesia Plan: General   Post-op Pain Management:    Induction: Intravenous  Airway Management Planned: Oral ETT  Additional Equipment:   Intra-op Plan:   Post-operative Plan: Extubation in OR  Informed Consent: I have reviewed the patients History and Physical, chart, labs and discussed the procedure including the risks, benefits and alternatives for the proposed anesthesia with the patient or authorized representative who has indicated his/her understanding and acceptance.   Dental advisory given  Plan Discussed with: CRNA and Surgeon  Anesthesia Plan Comments:         Anesthesia Quick Evaluation

## 2016-09-03 ENCOUNTER — Ambulatory Visit (HOSPITAL_COMMUNITY)
Admission: RE | Admit: 2016-09-03 | Discharge: 2016-09-03 | Disposition: A | Discharge status: Home / Self Care | Payer: BC Managed Care – PPO | Source: Ambulatory Visit | Attending: Surgery | Admitting: Surgery

## 2016-09-03 ENCOUNTER — Encounter (HOSPITAL_COMMUNITY)
Admission: RE | Disposition: A | Discharge status: Home / Self Care | Payer: Self-pay | Source: Ambulatory Visit | Attending: Surgery

## 2016-09-03 ENCOUNTER — Encounter (HOSPITAL_COMMUNITY): Payer: Self-pay

## 2016-09-03 ENCOUNTER — Ambulatory Visit (HOSPITAL_COMMUNITY): Payer: BC Managed Care – PPO | Admitting: Anesthesiology

## 2016-09-03 DIAGNOSIS — Z8261 Family history of arthritis: Secondary | ICD-10-CM | POA: Diagnosis not present

## 2016-09-03 DIAGNOSIS — Z823 Family history of stroke: Secondary | ICD-10-CM | POA: Diagnosis not present

## 2016-09-03 DIAGNOSIS — Z79899 Other long term (current) drug therapy: Secondary | ICD-10-CM | POA: Diagnosis not present

## 2016-09-03 DIAGNOSIS — E7800 Pure hypercholesterolemia, unspecified: Secondary | ICD-10-CM | POA: Diagnosis not present

## 2016-09-03 DIAGNOSIS — Z90710 Acquired absence of both cervix and uterus: Secondary | ICD-10-CM | POA: Insufficient documentation

## 2016-09-03 DIAGNOSIS — Z880 Allergy status to penicillin: Secondary | ICD-10-CM | POA: Diagnosis not present

## 2016-09-03 DIAGNOSIS — M1990 Unspecified osteoarthritis, unspecified site: Secondary | ICD-10-CM | POA: Diagnosis not present

## 2016-09-03 DIAGNOSIS — Z85820 Personal history of malignant melanoma of skin: Secondary | ICD-10-CM | POA: Diagnosis not present

## 2016-09-03 DIAGNOSIS — E210 Primary hyperparathyroidism: Secondary | ICD-10-CM | POA: Diagnosis present

## 2016-09-03 DIAGNOSIS — Z7982 Long term (current) use of aspirin: Secondary | ICD-10-CM | POA: Insufficient documentation

## 2016-09-03 DIAGNOSIS — M8580 Other specified disorders of bone density and structure, unspecified site: Secondary | ICD-10-CM | POA: Insufficient documentation

## 2016-09-03 DIAGNOSIS — Z853 Personal history of malignant neoplasm of breast: Secondary | ICD-10-CM | POA: Insufficient documentation

## 2016-09-03 DIAGNOSIS — Z8349 Family history of other endocrine, nutritional and metabolic diseases: Secondary | ICD-10-CM | POA: Insufficient documentation

## 2016-09-03 DIAGNOSIS — Z811 Family history of alcohol abuse and dependence: Secondary | ICD-10-CM | POA: Diagnosis not present

## 2016-09-03 DIAGNOSIS — Z885 Allergy status to narcotic agent status: Secondary | ICD-10-CM | POA: Diagnosis not present

## 2016-09-03 DIAGNOSIS — E039 Hypothyroidism, unspecified: Secondary | ICD-10-CM | POA: Diagnosis not present

## 2016-09-03 DIAGNOSIS — Z8249 Family history of ischemic heart disease and other diseases of the circulatory system: Secondary | ICD-10-CM | POA: Diagnosis not present

## 2016-09-03 DIAGNOSIS — D351 Benign neoplasm of parathyroid gland: Secondary | ICD-10-CM | POA: Diagnosis not present

## 2016-09-03 HISTORY — PX: PARATHYROIDECTOMY: SHX19

## 2016-09-03 SURGERY — PARATHYROIDECTOMY
Anesthesia: General | Laterality: Right

## 2016-09-03 MED ORDER — BUPIVACAINE HCL (PF) 0.25 % IJ SOLN
INTRAMUSCULAR | Status: AC
  Filled 2016-09-03: qty 30

## 2016-09-03 MED ORDER — CIPROFLOXACIN IN D5W 400 MG/200ML IV SOLN
400.0000 mg | INTRAVENOUS | Status: AC
  Administered 2016-09-03: 400 mg via INTRAVENOUS

## 2016-09-03 MED ORDER — LACTATED RINGERS IV SOLN: INTRAVENOUS | Status: DC

## 2016-09-03 MED ORDER — ARTIFICIAL TEARS OP OINT
TOPICAL_OINTMENT | OPHTHALMIC | Status: AC
  Filled 2016-09-03: qty 3.5

## 2016-09-03 MED ORDER — HYDROCODONE-ACETAMINOPHEN 5-325 MG PO TABS: 1.0000 | ORAL_TABLET | ORAL | 0 refills | Status: DC | PRN

## 2016-09-03 MED ORDER — LIDOCAINE 2% (20 MG/ML) 5 ML SYRINGE
INTRAMUSCULAR | Status: AC
  Filled 2016-09-03: qty 5

## 2016-09-03 MED ORDER — FENTANYL CITRATE (PF) 100 MCG/2ML IJ SOLN
INTRAMUSCULAR | Status: DC | PRN
  Administered 2016-09-03 (×3): 50 ug via INTRAVENOUS

## 2016-09-03 MED ORDER — ROCURONIUM BROMIDE 50 MG/5ML IV SOSY
PREFILLED_SYRINGE | INTRAVENOUS | Status: AC
  Filled 2016-09-03: qty 5

## 2016-09-03 MED ORDER — DEXAMETHASONE SODIUM PHOSPHATE 10 MG/ML IJ SOLN
INTRAMUSCULAR | Status: AC
  Filled 2016-09-03: qty 1

## 2016-09-03 MED ORDER — LIDOCAINE HCL 4 % MT SOLN
OROMUCOSAL | Status: DC | PRN
  Administered 2016-09-03: 4 mL via TOPICAL

## 2016-09-03 MED ORDER — CHLORHEXIDINE GLUCONATE CLOTH 2 % EX PADS: 6.0000 | MEDICATED_PAD | Freq: Once | CUTANEOUS | Status: DC

## 2016-09-03 MED ORDER — CIPROFLOXACIN IN D5W 400 MG/200ML IV SOLN
INTRAVENOUS | Status: AC
  Filled 2016-09-03: qty 200

## 2016-09-03 MED ORDER — SUGAMMADEX SODIUM 200 MG/2ML IV SOLN
INTRAVENOUS | Status: DC | PRN
  Administered 2016-09-03: 200 mg via INTRAVENOUS

## 2016-09-03 MED ORDER — EPHEDRINE SULFATE 50 MG/ML IJ SOLN
INTRAMUSCULAR | Status: DC | PRN
  Administered 2016-09-03 (×5): 10 mg via INTRAVENOUS

## 2016-09-03 MED ORDER — EPHEDRINE 5 MG/ML INJ
INTRAVENOUS | Status: AC
  Filled 2016-09-03: qty 10

## 2016-09-03 MED ORDER — PROPOFOL 10 MG/ML IV BOLUS
INTRAVENOUS | Status: DC | PRN
  Administered 2016-09-03: 150 mg via INTRAVENOUS

## 2016-09-03 MED ORDER — SUCCINYLCHOLINE CHLORIDE 200 MG/10ML IV SOSY
PREFILLED_SYRINGE | INTRAVENOUS | Status: AC
  Filled 2016-09-03: qty 10

## 2016-09-03 MED ORDER — LACTATED RINGERS IV SOLN
INTRAVENOUS | Status: DC | PRN
  Administered 2016-09-03: 07:00:00 via INTRAVENOUS

## 2016-09-03 MED ORDER — SUCCINYLCHOLINE CHLORIDE 200 MG/10ML IV SOSY
PREFILLED_SYRINGE | INTRAVENOUS | Status: DC | PRN
  Administered 2016-09-03: 100 mg via INTRAVENOUS

## 2016-09-03 MED ORDER — SUGAMMADEX SODIUM 200 MG/2ML IV SOLN
INTRAVENOUS | Status: AC
  Filled 2016-09-03: qty 2

## 2016-09-03 MED ORDER — FENTANYL CITRATE (PF) 250 MCG/5ML IJ SOLN
INTRAMUSCULAR | Status: AC
  Filled 2016-09-03: qty 5

## 2016-09-03 MED ORDER — ONDANSETRON HCL 4 MG/2ML IJ SOLN
INTRAMUSCULAR | Status: AC
  Filled 2016-09-03: qty 2

## 2016-09-03 MED ORDER — ONDANSETRON HCL 4 MG/2ML IJ SOLN
INTRAMUSCULAR | Status: DC | PRN
  Administered 2016-09-03: 4 mg via INTRAVENOUS

## 2016-09-03 MED ORDER — BUPIVACAINE HCL 0.25 % IJ SOLN
INTRAMUSCULAR | Status: DC | PRN
  Administered 2016-09-03: 10 mL

## 2016-09-03 MED ORDER — PROPOFOL 10 MG/ML IV BOLUS
INTRAVENOUS | Status: AC
  Filled 2016-09-03: qty 20

## 2016-09-03 MED ORDER — MIDAZOLAM HCL 5 MG/5ML IJ SOLN
INTRAMUSCULAR | Status: DC | PRN
  Administered 2016-09-03: 2 mg via INTRAVENOUS

## 2016-09-03 MED ORDER — LIDOCAINE 2% (20 MG/ML) 5 ML SYRINGE
INTRAMUSCULAR | Status: DC | PRN
  Administered 2016-09-03: 70 mg via INTRAVENOUS

## 2016-09-03 MED ORDER — ROCURONIUM BROMIDE 50 MG/5ML IV SOSY
PREFILLED_SYRINGE | INTRAVENOUS | Status: DC | PRN
  Administered 2016-09-03: 35 mg via INTRAVENOUS

## 2016-09-03 MED ORDER — HYDROMORPHONE HCL 1 MG/ML IJ SOLN: 0.2500 mg | INTRAMUSCULAR | Status: DC | PRN

## 2016-09-03 MED ORDER — PROMETHAZINE HCL 25 MG/ML IJ SOLN: 6.2500 mg | INTRAMUSCULAR | Status: DC | PRN

## 2016-09-03 MED ORDER — MIDAZOLAM HCL 2 MG/2ML IJ SOLN
INTRAMUSCULAR | Status: AC
  Filled 2016-09-03: qty 2

## 2016-09-03 MED ORDER — DEXAMETHASONE SODIUM PHOSPHATE 10 MG/ML IJ SOLN
INTRAMUSCULAR | Status: DC | PRN
  Administered 2016-09-03: 10 mg via INTRAVENOUS

## 2016-09-03 SURGICAL SUPPLY — 38 items
ATTRACTOMAT 16X20 MAGNETIC DRP (DRAPES) ×2 IMPLANT
BLADE HEX COATED 2.75 (ELECTRODE) ×2 IMPLANT
BLADE SURG 15 STRL LF DISP TIS (BLADE) ×1 IMPLANT
BLADE SURG 15 STRL SS (BLADE) ×1
CHLORAPREP W/TINT 26ML (MISCELLANEOUS) ×4 IMPLANT
CLIP TI MEDIUM 6 (CLIP) ×4 IMPLANT
CLIP TI WIDE RED SMALL 6 (CLIP) ×6 IMPLANT
COVER SURGICAL LIGHT HANDLE (MISCELLANEOUS) ×2 IMPLANT
DERMABOND ADVANCED (GAUZE/BANDAGES/DRESSINGS)
DERMABOND ADVANCED .7 DNX12 (GAUZE/BANDAGES/DRESSINGS) IMPLANT
DRAPE LAPAROTOMY T 98X78 PEDS (DRAPES) ×2 IMPLANT
ELECT PENCIL ROCKER SW 15FT (MISCELLANEOUS) ×2 IMPLANT
ELECT REM PT RETURN 9FT ADLT (ELECTROSURGICAL) ×2
ELECTRODE REM PT RTRN 9FT ADLT (ELECTROSURGICAL) ×1 IMPLANT
GAUZE SPONGE 2X2 8PLY STRL LF (GAUZE/BANDAGES/DRESSINGS) ×1 IMPLANT
GAUZE SPONGE 4X4 16PLY XRAY LF (GAUZE/BANDAGES/DRESSINGS) ×2 IMPLANT
GLOVE SURG ORTHO 8.0 STRL STRW (GLOVE) ×2 IMPLANT
GOWN STRL REUS W/TWL XL LVL3 (GOWN DISPOSABLE) ×6 IMPLANT
HEMOSTAT SURGICEL 2X4 FIBR (HEMOSTASIS) ×2 IMPLANT
ILLUMINATOR WAVEGUIDE N/F (MISCELLANEOUS) ×2 IMPLANT
KIT BASIN OR (CUSTOM PROCEDURE TRAY) ×2 IMPLANT
LIGHT WAVEGUIDE WIDE FLAT (MISCELLANEOUS) IMPLANT
NEEDLE HYPO 25X1 1.5 SAFETY (NEEDLE) ×2 IMPLANT
PACK BASIC VI WITH GOWN DISP (CUSTOM PROCEDURE TRAY) ×2 IMPLANT
SPONGE GAUZE 2X2 STER 10/PKG (GAUZE/BANDAGES/DRESSINGS) ×1
STAPLER VISISTAT 35W (STAPLE) ×2 IMPLANT
STRIP CLOSURE SKIN 1/2X4 (GAUZE/BANDAGES/DRESSINGS) ×2 IMPLANT
SUT MNCRL AB 4-0 PS2 18 (SUTURE) ×2 IMPLANT
SUT SILK 2 0 (SUTURE)
SUT SILK 2-0 18XBRD TIE 12 (SUTURE) IMPLANT
SUT SILK 3 0 (SUTURE)
SUT SILK 3-0 18XBRD TIE 12 (SUTURE) IMPLANT
SUT VIC AB 3-0 SH 18 (SUTURE) ×2 IMPLANT
SYR BULB IRRIGATION 50ML (SYRINGE) ×2 IMPLANT
SYR CONTROL 10ML LL (SYRINGE) ×2 IMPLANT
TOWEL OR 17X26 10 PK STRL BLUE (TOWEL DISPOSABLE) ×2 IMPLANT
TOWEL OR NON WOVEN STRL DISP B (DISPOSABLE) ×2 IMPLANT
YANKAUER SUCT BULB TIP 10FT TU (MISCELLANEOUS) ×2 IMPLANT

## 2016-09-03 NOTE — Addendum Note (Signed)
Addendum  created 09/03/16 1104 by Maxwell Caul, CRNA   Anesthesia Intra Meds edited

## 2016-09-03 NOTE — Anesthesia Procedure Notes (Signed)
Procedure Name: Intubation Date/Time: 09/03/2016 7:26 AM Performed by: Maxwell Caul Pre-anesthesia Checklist: Patient identified, Emergency Drugs available, Suction available and Patient being monitored Patient Re-evaluated:Patient Re-evaluated prior to inductionOxygen Delivery Method: Circle system utilized Preoxygenation: Pre-oxygenation with 100% oxygen Intubation Type: IV induction Ventilation: Mask ventilation without difficulty Laryngoscope Size: Mac and 4 Grade View: Grade I Tube type: Oral Tube size: 7.5 mm Number of attempts: 1 Airway Equipment and Method: Stylet Placement Confirmation: ETT inserted through vocal cords under direct vision,  positive ETCO2 and breath sounds checked- equal and bilateral Secured at: 21 cm Tube secured with: Tape Dental Injury: Teeth and Oropharynx as per pre-operative assessment

## 2016-09-03 NOTE — Transfer of Care (Signed)
Immediate Anesthesia Transfer of Care Note  Patient: Kelsey Andrews  Procedure(s) Performed: Procedure(s): RIGHT INFERIOR PARATHYROIDECTOMY (Right)  Patient Location: PACU  Anesthesia Type:General  Level of Consciousness:  sedated, patient cooperative and responds to stimulation  Airway & Oxygen Therapy:Patient Spontanous Breathing and Patient connected to face mask oxgen  Post-op Assessment:  Report given to PACU RN and Post -op Vital signs reviewed and stable  Post vital signs:  Reviewed and stable  Last Vitals:  Vitals:   09/03/16 0528  BP: (!) 150/81  Pulse: 60  Resp: 16  Temp: 35.7 C    Complications: No apparent anesthesia complications

## 2016-09-03 NOTE — Op Note (Signed)
OPERATIVE REPORT - PARATHYROIDECTOMY  Preoperative diagnosis: Primary hyperparathyroidism  Postop diagnosis: Same  Procedure: Right inferior minimally invasive parathyroidectomy  Surgeon:  Earnstine Regal, MD, FACS  Anesthesia: Gen. endotracheal  Estimated blood loss: Minimal  Preparation: ChloraPrep  Indications: The patient is a 66 year old female who presents with a parathyroid neoplasm.  Patient is referred by Dr. Crist Infante for evaluation and management of hypercalcemia and suspected primary hyperparathyroidism. Patient was noted on routine laboratory studies to have an elevated serum calcium level of 11.4. Additional laboratory studies included a vitamin D level which was normal at 54 and an intact PTH level which was normal at 31. Patient underwent 24 urine collection for calcium which was markedly elevated at 451. Patient complains of urinary frequency. She denies nephrolithiasis. She notes a history of osteopenia. Nuclear medicine parathyroid scan localizes an adenoma to the right inferior position.  Procedure: Patient was prepared in the holding area. He was brought to operating room and placed in a supine position on the operating room table. Following administration of general anesthesia, the patient was positioned and then prepped and draped in the usual strict aseptic fashion. After ascertaining that an adequate level of anesthesia been achieved, a neck incision was made with a #15 blade. Dissection was carried through subcutaneous tissues and platysma. Hemostasis was obtained with the electrocautery. Skin flaps were developed circumferentially and a Weitlander retractor was placed for exposure.  Strap muscles were incised in the midline. Strap muscles were reflected exposing the thyroid lobe. With gentle blunt dissection the thyroid lobe was mobilized.  Dissection was carried through adipose tissue and an enlarged parathyroid gland was identified. It was gently mobilized.  Vascular structures were divided between small and medium ligaclips. Care was taken to avoid the recurrent laryngeal nerve and the esophagus. The parathyroid gland was completely excised. It was submitted to pathology where frozen section confirmed parathyroid tissue consistent with adenoma.  Neck was irrigated with warm saline and good hemostasis was noted. Fibrillar was placed in the operative field. Strap muscles were reapproximated in the midline with interrupted 3-0 Vicryl sutures. Platysma was closed with interrupted 3-0 Vicryl sutures. Skin was closed with a running 4-0 Monocryl subcuticular suture. Marcaine was infiltrated circumferentially. Wound was washed and dried and Dermabond was applied. Patient was awakened from anesthesia and brought to the recovery room. The patient tolerated the procedure well.   Earnstine Regal, MD, [facility]

## 2016-09-03 NOTE — Interval H&P Note (Signed)
History and Physical Interval Note:  09/03/2016 7:03 AM  Kelsey Andrews  has presented today for surgery, with the diagnosis of primary hyperparathyroidism  The various methods of treatment have been discussed with the patient and family. After consideration of risks, benefits and other options for treatment, the patient has consented to    Procedure(s): RIGHT INFERIOR PARATHYROIDECTOMY (Right) as a surgical intervention .    The patient's history has been reviewed, patient examined, no change in status, stable for surgery.  I have reviewed the patient's chart and labs.  Questions were answered to the patient's satisfaction.    Earnstine Regal, MD, [facility] Office: [place]

## 2016-09-03 NOTE — Anesthesia Postprocedure Evaluation (Addendum)
Anesthesia Post Note  Patient: Kelsey Andrews  Procedure(s) Performed: Procedure(s) (LRB): RIGHT INFERIOR PARATHYROIDECTOMY (Right)  Patient location during evaluation: PACU Anesthesia Type: General Level of consciousness: awake and alert Pain management: pain level controlled Vital Signs Assessment: post-procedure vital signs reviewed and stable Respiratory status: spontaneous breathing, nonlabored ventilation, respiratory function stable and patient connected to nasal cannula oxygen Cardiovascular status: blood pressure returned to baseline and stable Postop Assessment: no signs of nausea or vomiting Anesthetic complications: no       Last Vitals:  Vitals:   09/03/16 0930 09/03/16 0943  BP: (!) 178/92 (!) 159/86  Pulse: 92 91  Resp: 15 14  Temp: 36.6 C 36.8 C    Last Pain:  Vitals:   09/03/16 0930  TempSrc:   PainSc: 4                  Kearia Yin S

## 2016-11-15 ENCOUNTER — Other Ambulatory Visit: Payer: Self-pay | Admitting: Internal Medicine

## 2016-11-15 DIAGNOSIS — Z853 Personal history of malignant neoplasm of breast: Secondary | ICD-10-CM

## 2016-11-15 DIAGNOSIS — Z1231 Encounter for screening mammogram for malignant neoplasm of breast: Secondary | ICD-10-CM

## 2016-11-29 NOTE — Addendum Note (Signed)
Addendum  created 11/29/16 1217 by Myrtie Soman, MD   Sign clinical note

## 2016-12-08 ENCOUNTER — Ambulatory Visit
Admission: RE | Admit: 2016-12-08 | Discharge: 2016-12-08 | Disposition: A | Discharge status: Home / Self Care | Payer: BC Managed Care – PPO | Source: Ambulatory Visit | Attending: Internal Medicine | Admitting: Internal Medicine

## 2016-12-08 DIAGNOSIS — Z853 Personal history of malignant neoplasm of breast: Secondary | ICD-10-CM

## 2016-12-08 DIAGNOSIS — Z1231 Encounter for screening mammogram for malignant neoplasm of breast: Secondary | ICD-10-CM

## 2016-12-08 HISTORY — DX: Personal history of irradiation: Z92.3

## 2016-12-09 ENCOUNTER — Ambulatory Visit (HOSPITAL_COMMUNITY): Payer: BC Managed Care – PPO

## 2017-03-08 DIAGNOSIS — Z01419 Encounter for gynecological examination (general) (routine) without abnormal findings: Secondary | ICD-10-CM | POA: Diagnosis not present

## 2017-03-08 DIAGNOSIS — Z6834 Body mass index (BMI) 34.0-34.9, adult: Secondary | ICD-10-CM | POA: Diagnosis not present

## 2017-04-15 ENCOUNTER — Encounter (HOSPITAL_COMMUNITY): Payer: Self-pay | Admitting: Oncology

## 2017-04-15 ENCOUNTER — Encounter (HOSPITAL_COMMUNITY): Payer: Medicare Other | Attending: Oncology | Admitting: Oncology

## 2017-04-15 VITALS — BP 133/72 | HR 76 | Temp 98.1°F | Resp 18 | Wt 212.3 lb

## 2017-04-15 DIAGNOSIS — C50911 Malignant neoplasm of unspecified site of right female breast: Secondary | ICD-10-CM

## 2017-04-15 DIAGNOSIS — M8580 Other specified disorders of bone density and structure, unspecified site: Secondary | ICD-10-CM

## 2017-04-15 DIAGNOSIS — Z23 Encounter for immunization: Secondary | ICD-10-CM | POA: Diagnosis not present

## 2017-04-15 DIAGNOSIS — R5382 Chronic fatigue, unspecified: Secondary | ICD-10-CM | POA: Diagnosis not present

## 2017-04-15 DIAGNOSIS — Z853 Personal history of malignant neoplasm of breast: Secondary | ICD-10-CM

## 2017-04-15 MED ORDER — INFLUENZA VAC SPLIT QUAD 0.5 ML IM SUSY
0.5000 mL | PREFILLED_SYRINGE | Freq: Once | INTRAMUSCULAR | Status: AC
  Administered 2017-04-15: 0.5 mL via INTRAMUSCULAR

## 2017-04-15 MED ORDER — INFLUENZA VAC SPLIT QUAD 0.5 ML IM SUSY
PREFILLED_SYRINGE | INTRAMUSCULAR | Status: AC
  Filled 2017-04-15: qty 0.5

## 2017-04-15 NOTE — Patient Instructions (Signed)
[facility] at [facility] Discharge Instructions  RECOMMENDATIONS MADE BY THE CONSULTANT AND ANY TEST RESULTS WILL BE SENT TO YOUR REFERRING PHYSICIAN.  You saw Dr. Zhou today.  Thank you for choosing [facility] at [facility] to provide your oncology and hematology care.  To afford each patient quality time with our provider, please arrive at least 15 minutes before your scheduled appointment time.    If you have a lab appointment with the Cancer Center please come in thru the  Main Entrance and check in at the main information desk  You need to re-schedule your appointment should you arrive 10 or more minutes late.  We strive to give you quality time with our providers, and arriving late affects you and other patients whose appointments are after yours.  Also, if you no show three or more times for appointments you may be dismissed from the clinic at the providers discretion.     Again, thank you for choosing [facility].  Our hope is that these requests will decrease the amount of time that you wait before being seen by our physicians.       _____________________________________________________________  Should you have questions after your visit to [facility], please contact our office at [phone] between the hours of 8:30 a.m. and 4:30 p.m.  Voicemails left after 4:30 p.m. will not be returned until the following business day.  For prescription refill requests, have your pharmacy contact our office.       Resources For Cancer Patients and their Caregivers ? American Cancer Society: Can assist with transportation, wigs, general needs, runs Look Good Feel Better.        1-888-227-6333 ? Cancer Care: Provides financial assistance, online support groups, medication/co-pay assistance.  1-800-813-HOPE (4673) ? [facility] Assists [place] cancer patients and their families through  emotional , educational and financial support.  336-427-4357 ? [place] DSS Where to apply for food stamps, Medicaid and utility assistance. 336-342-1394 ? [facility]: Transportation to medical appointments. 336-347-2287 ? Social Security Administration: May apply for disability if have a Stage IV cancer. 336-342-7796 1-800-772-1213 ? [place] Aging, Disability and Transit Services: Assists with nutrition, care and transit needs. 336-349-2343  Cancer Center Support Programs: @10RELATIVEDAYS@ > Cancer Support Group  2nd Tuesday of the month 1pm-2pm, Journey Room  > Creative Journey  3rd Tuesday of the month 1130am-1pm, Journey Room  > Look Good Feel Better  1st Wednesday of the month 10am-12 noon, Journey Room (Call American Cancer Society to register 1-800-395-5775)    

## 2017-04-15 NOTE — Progress Notes (Signed)
Kelsey Andrews, Kelsey Andrews [id number]  Invasive ductal carcinoma of right breast, stage 1 (HCC)  CURRENT THERAPY: Arimidex daily. Chart notes that she started Arimidex on 12/18/2008, but she notes that she started following completion of radiation in September. She finished AI therapy in September 2015.   INTERVAL HISTORY: Kelsey Andrews 66 y.o. 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 A core biopsy of the right breast and 11/13/2008 which showed DCIS. She had lumpectomy on 12/10/2008 which showed invasive ductal carcinoma grade 2/3 that was 0.7 cm in greatest dimension. 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 Sentinel lymph node biopsy was negative for evidence of metastasis.  Tumor was ER positive PR negative HER-2/neu negative. She had repeat resection to clear margins on 12/26/2008 and this was reported as:  - ONE FOCUS OF INTERMEDIATE NUCLEAR GRADE DUCTAL CARCINOMA IN SITU, CLOSE TO THE INKED NEW DEEP MARGIN. - SKELETAL MUSCLE TISSUE PRESENT, NEGATIVE FOR CARCINOMA. She was therefore treated with postoperative radiation and then started on Arimidex which according to our records she is a scheduled to complete at the end of June 2015, but the patient thinks she started in September 2010. She finished in September 2015.     Invasive ductal carcinoma of right breast, stage 1 (HCC)   11/13/2008 Initial Diagnosis    Core biopsy of right breast showing DCIS      12/10/2008 Surgery    Lumpectomy of right breast- demonstrating invasive ductal carcinoma, 0.7 cm, grade II, with positive DCIS at the posterior marked margin.  Negative sentinel lymph node biopsy      12/26/2008 Surgery    Repeat resection to clear margins      01/30/2009 - 03/19/2009 Radiation Therapy    Dr.  Pablo Ledger      03/20/2009 - 03/27/2014 Chemotherapy    Approximate start date of Arimidex.  Will complete 5 years worth of therapy in September 2015      Patient presents today for follow up. Since her last visit she has had a right parathyroidectomy for a nodule which turned out to be benign. Since the surgery she has had residual hoarseness. Otherwise she has been doing well. She has chronic fatigue which is likely due to sleep deprivation since she wakes up at 5 am daily to go to work and also her weight. She has not felt any masses on her bilateral breasts. Appetite is good. No pain. Otherwise ROS is negative. She denies any headaches, dizziness, double vision, fevers, chills, night sweats, nausea, vomiting, diarrhea, constipation, chest pain, heart palpitations, shortness of breath, blood in stool, black tarry stool, urinary pain, urinary burning, urinary frequency, hematuria.    Past Medical History:  Diagnosis Date  . Adenomatous colon polyp   . Arthritis   . Breast cancer ([place]) 2010   rt. RADIATION DONE  . Invasive ductal carcinoma of right breast, stage 1 ([place]) 08/25/2011   Started Arimidex on 07/20/2008.  Stage I (T1b N0) intermediate grade invasive ductal carcinoma of the right breast with biopsy on 11/13/2008, re-excision on 12/25/2008 with no further invasive disease found. 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 Her invasive disease was ER positive at 10  . Melanoma of  shoulder ([place])   . Osteopenia 04/02/2014  . Personal history of radiation therapy 2010  . Primary hyperparathyroidism ([place])   . Thyroid activity decreased     has Invasive ductal carcinoma of right breast, stage 1 ([place]); Osteopenia; and Hyperparathyroidism, primary ([place]) on her problem list.     is allergic to morphine and related and penicillins.  [name] had no medications administered during this  visit.  Past Surgical History:  Procedure Laterality Date  . ABDOMINAL HYSTERECTOMY    . BREAST BIOPSY Right 2010   Malignant Stereo Biopsy   . BREAST LUMPECTOMY  2010 X 2   rt. WITH LYMPH NODE REMOVAL  . BTL    . carpel tunnel     both  . COLONOSCOPY    . LAPAROSCOPIC TOTAL HYSTERECTOMY    . PARATHYROIDECTOMY Right 09/03/2016   Procedure: RIGHT INFERIOR PARATHYROIDECTOMY;  Surgeon: Armandina Gemma, MD;  Location: [facility];  Service: General;  Laterality: Right;  . removal of melanoma     Rt shoulder  . TISSUE EXPANDER  REMOVAL W/ REPLACEMENT OF IMPLANT     rt. breast tissue removed  . tonscellectomy         PHYSICAL EXAMINATION  ECOG PERFORMANCE STATUS: 0 - Asymptomatic  Vitals:   04/15/17 1402  BP: 133/72  Pulse: 76  Resp: 18  Temp: 98.1 F (36.7 C)  SpO2: 96%    GENERAL:alert, no distress, well nourished, well developed, comfortable, cooperative, obese and smiling SKIN: skin color, texture, turgor are normal, no rashes or significant lesions HEAD: Normocephalic, No masses, lesions, tenderness or abnormalities EYES: normal, PERRLA, EOMI, Conjunctiva are pink and non-injected EARS: External ears normal OROPHARYNX:lips, buccal mucosa, and tongue normal and mucous membranes are moist  NECK: supple, no adenopathy, thyroid normal size, non-tender, without nodularity, no stridor, non-tender, trachea midline LYMPH:  no palpable lymphadenopathy, no hepatosplenomegaly BREAST: Right breast vertical incision at 7 oclock position that extends to nipples. Telangiectasia around inferior right breast, secondary to XRT.  Left breast without masses, nipple discharge, skin changes. No axillary lymphadenopathy bilaterally. LUNGS: clear to auscultation and percussion HEART: arrhythmia, no murmurs, no gallops, S1 normal and S2 normal ABDOMEN:abdomen soft, non-tender, obese, normal bowel sounds, no masses or organomegaly and no hepatosplenomegaly BACK: Back symmetric, no curvature., No CVA  tenderness EXTREMITIES:less then 2 second capillary refill, no joint deformities, effusion, or inflammation, no edema, no skin discoloration, no clubbing, no cyanosis  NEURO: alert & oriented x 3 with fluent speech, no focal motor/sensory deficits, gait normal   RADIOGRAPHIC STUDIES:  CLINICAL DATA:  66 year old female presenting for routine postlumpectomy follow-up status post right breast lumpectomy in June of 2010.  EXAM: 2D DIGITAL DIAGNOSTIC BILATERAL MAMMOGRAM WITH CAD AND ADJUNCT TOMO  COMPARISON:  Previous exam(s).  ACR Breast Density Category b: There are scattered areas of fibroglandular density.  FINDINGS: Stable right breast lumpectomy site. No suspicious calcifications, masses or areas of distortion are seen in the bilateral breasts.  Mammographic images were processed with CAD.  IMPRESSION: Stable right breast lumpectomy site. No mammographic evidence of malignancy in the bilateral breasts.  RECOMMENDATION: Screening mammogram in one year.(Code:SM-B-01Y)  I have discussed the findings and recommendations with the patient. Results were also provided in writing at the conclusion of the visit. If applicable, a reminder letter will be sent to the patient regarding the next appointment.  BI-RADS CATEGORY  2: Benign.   Electronically Signed   By: Ammie Ferrier M.D.   On: 12/08/2015 13:46    ASSESSMENT:  1. 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 A core biopsy of the right breast and 11/13/2008 which showed DCIS. She had lumpectomy on 12/10/2008 which showed invasive ductal carcinoma grade 2/3 that was 0.7 cm in greatest dimension. 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 Sentinel lymph node biopsy was negative for evidence of metastasis.  Tumor was ER positive PR negative HER-2/neu negative. She  had repeat resection to clear margins on 12/26/2008 and this was reported as:  - ONE FOCUS OF INTERMEDIATE NUCLEAR GRADE DUCTAL CARCINOMA IN SITU, CLOSE TO THE INKED NEW DEEP MARGIN. - SKELETAL MUSCLE TISSUE PRESENT, NEGATIVE FOR CARCINOMA. She was therefore treated with postoperative radiation and then started on Arimidex which according to our records she is a scheduled to complete at the end of June 2015, but the patient thinks she started in September 2010. She therefore completed Arimidex therapy in September 2015 after 5 years of endocrine therapy.  2. Osteopenia of left femur with a T-score of -1.2. On Ca++ and Vit D.   Patient Active Problem List   Diagnosis Date Noted  . Hyperparathyroidism, primary ([place]) 09/01/2016  . Osteopenia 04/02/2014  . Invasive ductal carcinoma of right breast, stage 1 ([place]) 08/25/2011    PLAN:  Clinically NED on breast exam today. Next screening mammogram is due in June 2019, I have ordered it today. Flu shot given today per patient's request. PCP is managing her osteopenia. RTC in 1 year for annual follow up.  She is to continue with annual screening mammogram. In the interim, we will perform surveillance per NCCN guidelines.  NCCN guidelines recommends the following surveillance for invasive breast cancer:  A. History and Physical exam every 4-6 months for 5 years and then every 12 months.  B. Mammography every 12 months  C. Women on Tamoxifen: annual gynecologic assessment every 12 months if uterus is present.  D. Women on aromatase inhibitor or who experience ovarian failure secondary to treatment should have monitoring of bone health with a bone mineral density determination at baseline and periodically thereafter.  E. Assess and encourage adherence to adjuvant endocrine therapy.  F. Evidence suggests that active lifestyle and achieving and maintaining an ideal body weight (20-25 BMI) may lead to optimal breast cancer outcomes.    All questions  were answered. The patient knows to call the clinic with any problems, questions or concerns. We can certainly see the patient much sooner if necessary.    Twana First, MD  04/15/2017

## 2017-04-15 NOTE — Progress Notes (Signed)
Pt given flu shot in left deltoid. Pt tolerated well. Pt stable and discharged home ambulatory.  

## 2017-04-18 DIAGNOSIS — R0602 Shortness of breath: Secondary | ICD-10-CM | POA: Diagnosis not present

## 2017-04-18 DIAGNOSIS — Z885 Allergy status to narcotic agent status: Secondary | ICD-10-CM | POA: Diagnosis not present

## 2017-04-18 DIAGNOSIS — E890 Postprocedural hypothyroidism: Secondary | ICD-10-CM | POA: Diagnosis not present

## 2017-04-18 DIAGNOSIS — J3801 Paralysis of vocal cords and larynx, unilateral: Secondary | ICD-10-CM | POA: Diagnosis not present

## 2017-04-18 DIAGNOSIS — R1310 Dysphagia, unspecified: Secondary | ICD-10-CM | POA: Diagnosis not present

## 2017-04-18 DIAGNOSIS — Z880 Allergy status to penicillin: Secondary | ICD-10-CM | POA: Diagnosis not present

## 2017-04-18 DIAGNOSIS — R490 Dysphonia: Secondary | ICD-10-CM | POA: Diagnosis not present

## 2017-07-13 DIAGNOSIS — E038 Other specified hypothyroidism: Secondary | ICD-10-CM | POA: Diagnosis not present

## 2017-07-13 DIAGNOSIS — M859 Disorder of bone density and structure, unspecified: Secondary | ICD-10-CM | POA: Diagnosis not present

## 2017-07-13 DIAGNOSIS — R300 Dysuria: Secondary | ICD-10-CM | POA: Diagnosis not present

## 2017-07-13 DIAGNOSIS — E7849 Other hyperlipidemia: Secondary | ICD-10-CM | POA: Diagnosis not present

## 2017-07-20 DIAGNOSIS — M859 Disorder of bone density and structure, unspecified: Secondary | ICD-10-CM | POA: Diagnosis not present

## 2017-07-20 DIAGNOSIS — E7849 Other hyperlipidemia: Secondary | ICD-10-CM | POA: Diagnosis not present

## 2017-07-20 DIAGNOSIS — E8940 Asymptomatic postprocedural ovarian failure: Secondary | ICD-10-CM | POA: Diagnosis not present

## 2017-07-20 DIAGNOSIS — R32 Unspecified urinary incontinence: Secondary | ICD-10-CM | POA: Diagnosis not present

## 2017-07-20 DIAGNOSIS — E668 Other obesity: Secondary | ICD-10-CM | POA: Diagnosis not present

## 2017-07-20 DIAGNOSIS — Z6835 Body mass index (BMI) 35.0-35.9, adult: Secondary | ICD-10-CM | POA: Diagnosis not present

## 2017-07-20 DIAGNOSIS — R682 Dry mouth, unspecified: Secondary | ICD-10-CM | POA: Diagnosis not present

## 2017-07-20 DIAGNOSIS — R1319 Other dysphagia: Secondary | ICD-10-CM | POA: Diagnosis not present

## 2017-07-20 DIAGNOSIS — Z0000 Encounter for general adult medical examination without abnormal findings: Secondary | ICD-10-CM | POA: Diagnosis not present

## 2017-07-20 DIAGNOSIS — R011 Cardiac murmur, unspecified: Secondary | ICD-10-CM | POA: Diagnosis not present

## 2017-07-20 DIAGNOSIS — Z1389 Encounter for screening for other disorder: Secondary | ICD-10-CM | POA: Diagnosis not present

## 2017-07-20 DIAGNOSIS — R002 Palpitations: Secondary | ICD-10-CM | POA: Diagnosis not present

## 2017-07-20 DIAGNOSIS — Z23 Encounter for immunization: Secondary | ICD-10-CM | POA: Diagnosis not present

## 2017-07-22 DIAGNOSIS — Z1212 Encounter for screening for malignant neoplasm of rectum: Secondary | ICD-10-CM | POA: Diagnosis not present

## 2017-08-08 DIAGNOSIS — H2513 Age-related nuclear cataract, bilateral: Secondary | ICD-10-CM | POA: Diagnosis not present

## 2017-08-08 DIAGNOSIS — H524 Presbyopia: Secondary | ICD-10-CM | POA: Diagnosis not present

## 2017-08-08 DIAGNOSIS — H52223 Regular astigmatism, bilateral: Secondary | ICD-10-CM | POA: Diagnosis not present

## 2017-08-15 DIAGNOSIS — E782 Mixed hyperlipidemia: Secondary | ICD-10-CM | POA: Diagnosis not present

## 2017-08-15 DIAGNOSIS — R0609 Other forms of dyspnea: Secondary | ICD-10-CM | POA: Diagnosis not present

## 2017-08-15 DIAGNOSIS — J3800 Paralysis of vocal cords and larynx, unspecified: Secondary | ICD-10-CM | POA: Diagnosis not present

## 2017-08-31 DIAGNOSIS — R0609 Other forms of dyspnea: Secondary | ICD-10-CM | POA: Diagnosis not present

## 2017-09-05 DIAGNOSIS — R0602 Shortness of breath: Secondary | ICD-10-CM | POA: Diagnosis not present

## 2017-09-08 DIAGNOSIS — J3800 Paralysis of vocal cords and larynx, unspecified: Secondary | ICD-10-CM | POA: Diagnosis not present

## 2017-09-08 DIAGNOSIS — R490 Dysphonia: Secondary | ICD-10-CM | POA: Diagnosis not present

## 2017-09-27 DIAGNOSIS — M859 Disorder of bone density and structure, unspecified: Secondary | ICD-10-CM | POA: Diagnosis not present

## 2017-09-27 DIAGNOSIS — E210 Primary hyperparathyroidism: Secondary | ICD-10-CM | POA: Diagnosis not present

## 2017-10-03 DIAGNOSIS — L821 Other seborrheic keratosis: Secondary | ICD-10-CM | POA: Diagnosis not present

## 2017-10-03 DIAGNOSIS — Z85820 Personal history of malignant melanoma of skin: Secondary | ICD-10-CM | POA: Diagnosis not present

## 2017-10-03 DIAGNOSIS — L245 Irritant contact dermatitis due to other chemical products: Secondary | ICD-10-CM | POA: Diagnosis not present

## 2017-10-03 DIAGNOSIS — D225 Melanocytic nevi of trunk: Secondary | ICD-10-CM | POA: Diagnosis not present

## 2017-10-03 DIAGNOSIS — D2272 Melanocytic nevi of left lower limb, including hip: Secondary | ICD-10-CM | POA: Diagnosis not present

## 2017-10-03 DIAGNOSIS — D2239 Melanocytic nevi of other parts of face: Secondary | ICD-10-CM | POA: Diagnosis not present

## 2017-12-13 DIAGNOSIS — J3801 Paralysis of vocal cords and larynx, unilateral: Secondary | ICD-10-CM | POA: Diagnosis not present

## 2017-12-14 ENCOUNTER — Other Ambulatory Visit: Payer: Self-pay | Admitting: Internal Medicine

## 2017-12-14 DIAGNOSIS — Z1231 Encounter for screening mammogram for malignant neoplasm of breast: Secondary | ICD-10-CM

## 2017-12-19 ENCOUNTER — Ambulatory Visit (HOSPITAL_COMMUNITY): Payer: Medicare Other

## 2018-01-03 ENCOUNTER — Ambulatory Visit: Payer: Medicare Other

## 2018-01-11 ENCOUNTER — Ambulatory Visit
Admission: RE | Admit: 2018-01-11 | Discharge: 2018-01-11 | Disposition: A | Discharge status: Home / Self Care | Payer: Medicare Other | Source: Ambulatory Visit | Attending: Internal Medicine | Admitting: Internal Medicine

## 2018-01-11 DIAGNOSIS — Z1231 Encounter for screening mammogram for malignant neoplasm of breast: Secondary | ICD-10-CM

## 2018-01-26 DIAGNOSIS — J3801 Paralysis of vocal cords and larynx, unilateral: Secondary | ICD-10-CM | POA: Diagnosis not present

## 2018-01-26 DIAGNOSIS — R490 Dysphonia: Secondary | ICD-10-CM | POA: Diagnosis not present

## 2018-01-26 DIAGNOSIS — E039 Hypothyroidism, unspecified: Secondary | ICD-10-CM | POA: Diagnosis not present

## 2018-03-01 DIAGNOSIS — J3801 Paralysis of vocal cords and larynx, unilateral: Secondary | ICD-10-CM | POA: Diagnosis not present

## 2018-03-09 DIAGNOSIS — Z124 Encounter for screening for malignant neoplasm of cervix: Secondary | ICD-10-CM | POA: Diagnosis not present

## 2018-03-09 DIAGNOSIS — Z6829 Body mass index (BMI) 29.0-29.9, adult: Secondary | ICD-10-CM | POA: Diagnosis not present

## 2018-04-21 ENCOUNTER — Other Ambulatory Visit: Payer: Self-pay

## 2018-04-21 ENCOUNTER — Encounter (HOSPITAL_COMMUNITY): Payer: Self-pay | Admitting: Hematology

## 2018-04-21 ENCOUNTER — Inpatient Hospital Stay (HOSPITAL_COMMUNITY): Payer: Medicare Other | Attending: Hematology | Admitting: Hematology

## 2018-04-21 DIAGNOSIS — Z79899 Other long term (current) drug therapy: Secondary | ICD-10-CM | POA: Insufficient documentation

## 2018-04-21 DIAGNOSIS — C50911 Malignant neoplasm of unspecified site of right female breast: Secondary | ICD-10-CM

## 2018-04-21 DIAGNOSIS — Z853 Personal history of malignant neoplasm of breast: Secondary | ICD-10-CM | POA: Insufficient documentation

## 2018-04-21 DIAGNOSIS — Z23 Encounter for immunization: Secondary | ICD-10-CM | POA: Insufficient documentation

## 2018-04-21 DIAGNOSIS — Z7982 Long term (current) use of aspirin: Secondary | ICD-10-CM

## 2018-04-21 DIAGNOSIS — Z923 Personal history of irradiation: Secondary | ICD-10-CM | POA: Insufficient documentation

## 2018-04-21 DIAGNOSIS — M8580 Other specified disorders of bone density and structure, unspecified site: Secondary | ICD-10-CM

## 2018-04-21 MED ORDER — INFLUENZA VAC SPLIT QUAD 0.5 ML IM SUSY
PREFILLED_SYRINGE | INTRAMUSCULAR | Status: AC
  Filled 2018-04-21: qty 0.5

## 2018-04-21 MED ORDER — INFLUENZA VAC SPLIT QUAD 0.5 ML IM SUSY
0.5000 mL | PREFILLED_SYRINGE | Freq: Once | INTRAMUSCULAR | Status: AC
  Administered 2018-04-21: 0.5 mL via INTRAMUSCULAR

## 2018-04-21 NOTE — Assessment & Plan Note (Signed)
1.  Stage I (T1b N0) right breast IDC ER positive, PR negative, HER-2 negative: - Diagnosed on 11/13/2008, reexcision on 12/25/2008, status post radiation therapy. - Oncotype DX recurrence score of 21, recurrence around 13% at 10 years after therapy with tamoxifen. - Anastrozole started on 07/20/2008, completed 5 years. - Today's examination did not reveal any palpable masses.  Lumpectomy site is within normal limits on the right breast.  No palpable adenopathy. -I have reviewed mammogram dated 01/11/2018 which was BI-RADS Category 2. -She will receive a flu vaccine today.  She will be back in 1 year for follow-up. -She was counseled to do weightbearing exercises.

## 2018-04-21 NOTE — Progress Notes (Signed)
Patient Care Team: Crist Infante, MD as PCP - General (Internal Medicine)  DIAGNOSIS:  Encounter Diagnosis  Name Primary?  . Invasive ductal carcinoma of right breast, stage 1 (HCC)     SUMMARY OF ONCOLOGIC HISTORY:   Invasive ductal carcinoma of right breast, stage 1 ([place])   11/13/2008 Initial Diagnosis    Core biopsy of right breast showing DCIS    12/10/2008 Surgery    Lumpectomy of right breast- demonstrating invasive ductal carcinoma, 0.7 cm, grade II, with positive DCIS at the posterior marked margin.  Negative sentinel lymph node biopsy    12/26/2008 Surgery    Repeat resection to clear margins    01/30/2009 - 03/19/2009 Radiation Therapy    Dr. Pablo Ledger    03/20/2009 - 03/27/2014 Chemotherapy    Approximate start date of Arimidex.  Will complete 5 years worth of therapy in September 2015     CHIEF COMPLIANT: Follow-up of right breast cancer.  INTERVAL HISTORY: Kelsey Andrews is a very pleasant 67 year old white female who is seen in follow-up for right breast cancer.  She denies any new onset pains.  Denies any fevers, night sweats or weight loss.  Has chronic knee and right hip pain which is stable.  Energy and appetite are reported at 100%.  Denies any recent hospitalizations or infections.  REVIEW OF SYSTEMS:   Constitutional: Denies fevers, chills or abnormal weight loss Eyes: Denies blurriness of vision Ears, nose, mouth, throat, and face: Denies mucositis or sore throat Respiratory: Denies cough, dyspnea or wheezes Cardiovascular: Denies palpitation, chest discomfort Gastrointestinal:  Denies nausea, heartburn or change in bowel habits Skin: Denies abnormal skin rashes Lymphatics: Denies new lymphadenopathy or easy bruising Neurological:Denies numbness, tingling or new weaknesses Behavioral/Psych: Mood is stable, no new changes  Extremities: No lower extremity edema Breast:  denies any pain or lumps or nodules in either breasts All other systems were reviewed  with the patient and are negative.  I have reviewed the past medical history, past surgical history, social history and family history with the patient and they are unchanged from previous note.  ALLERGIES:  is allergic to morphine and related and penicillins.  MEDICATIONS:  Current Outpatient Medications  Medication Sig Dispense Refill  . aspirin 81 MG EC tablet Take 81 mg by mouth daily.      Marland Kitchen b complex vitamins tablet Take 1 tablet by mouth daily.    . Cholecalciferol (VITAMIN D-3) 5000 units TABS Take 5,000 Units by mouth daily.    . hydrOXYzine (ATARAX) 10 MG tablet Take 10 mg by mouth at bedtime.     Marland Kitchen ibuprofen (ADVIL,MOTRIN) 200 MG tablet Take 400 mg by mouth every 6 (six) hours as needed for headache or mild pain.    Marland Kitchen levothyroxine (SYNTHROID, LEVOTHROID) 137 MCG tablet Take 137 mcg by mouth at bedtime.     . Multiple Vitamins-Minerals (MULTIVITAMIN WITH MINERALS) tablet Take 1 tablet by mouth daily.      . Omega-3 Fatty Acids (FISH OIL PO) Take 1 capsule by mouth daily.    Marland Kitchen PREVIDENT 5000 DRY MOUTH 1.1 % GEL dental gel Place 1 application onto teeth See admin instructions. 2 - 3 times daily    . ALPRAZolam (XANAX) 0.5 MG tablet Take 0.5 mg by mouth daily as needed for anxiety or sleep.     Marland Kitchen HYDROcodone-acetaminophen (NORCO/VICODIN) 5-325 MG tablet Take 1-2 tablets by mouth every 4 (four) hours as needed for moderate pain. (Patient not taking: Reported on 04/21/2018) 20 tablet 0  No current facility-administered medications for this visit.     PHYSICAL EXAMINATION: ECOG PERFORMANCE STATUS: 1 - Symptomatic but completely ambulatory  Vitals:   04/21/18 1450  BP: 126/75  Pulse: (!) 56  Resp: 18  Temp: 98.1 F (36.7 C)  SpO2: 98%   Filed Weights   04/21/18 1450  Weight: 178 lb 8 oz (81 kg)    GENERAL:alert, no distress and comfortable SKIN: skin color, texture, turgor are normal, no rashes or significant lesions EYES: normal, Conjunctiva are pink and non-injected,  sclera clear OROPHARYNX:no mucositis, no erythema and lips, buccal mucosa, and tongue normal  NECK: supple, thyroid normal size, non-tender, without nodularity LYMPH:  no palpable lymphadenopathy in the cervical, axillary or inguinal LUNGS: clear to auscultation and percussion with normal breathing effort HEART: Regular rate and rhythm.  No leg edema. ABDOMEN:abdomen soft, non-tender and normal bowel sounds MUSCULOSKELETAL:no cyanosis of digits and no clubbing   EXTREMITIES: No lower extremity edema BREAST: Right breast lumpectomy site is within normal limits.  Radiation skin changes noted.  No palpable mass in bilateral breast.  No palpable adenopathy.  LABORATORY DATA:  I have reviewed the data as listed CMP Latest Ref Rng & Units 08/23/2016 04/05/2014 08/09/2012  Glucose 65 - 99 mg/dL 134(H) 103(H) 99  BUN 6 - 20 mg/dL _0 Creatinine 0.44 - 1.00 mg/dL 0.71 0.70 0.77  Sodium 135 - 145 mmol/L 141 140 139  Potassium 3.5 - 5.1 mmol/L 4.2 4.0 4.8  Chloride 101 - 111 mmol/L 107 102 102  CO2 22 - 32 mmol/L _1 Calcium 8.9 - 10.3 mg/dL 10.4(H) 10.2 10.4  Total Protein 6.0 - 8.3 g/dL - 7.5 7.2  Total Bilirubin 0.3 - 1.2 mg/dL - 0.3 0.4  Alkaline Phos 39 - 117 U/L - 104 100  AST 0 - 37 U/L - 38(H) 32  ALT 0 - 35 U/L - 40(H) 36(H)   No results found for: VOH607   Lab Results  Component Value Date   WBC 8.1 08/23/2016   HGB 14.2 08/23/2016   HCT 41.0 08/23/2016   MCV 85.8 08/23/2016   PLT 302 08/23/2016   NEUTROABS 2.8 04/05/2014   Radiology: I have personally reviewed her mammogram dated 01/11/2018.  ASSESSMENT & PLAN:  Invasive ductal carcinoma of right breast, stage 1 (HCC) 1.  Stage I (T1b N0) right breast IDC ER positive, PR negative, HER-2 negative: - Diagnosed on 11/13/2008, reexcision on 12/25/2008, status post radiation therapy. - Oncotype DX recurrence score of 21, recurrence around 13% at 10 years after therapy with tamoxifen. - Anastrozole started on  07/20/2008, completed 5 years. - Today's examination did not reveal any palpable masses.  Lumpectomy site is within normal limits on the right breast.  No palpable adenopathy. -I have reviewed mammogram dated 01/11/2018 which was BI-RADS Category 2. -She will receive a flu vaccine today.  She will be back in 1 year for follow-up. -She was counseled to do weightbearing exercises.    Breast Cancer therapy associated bone loss: I have recommended calcium, Vitamin D and weight bearing exercises.    Derek Jack, MD 04/21/18

## 2018-04-21 NOTE — Progress Notes (Signed)
Pt given flu shot in left deltoid. Pt tolerated injection well with no complaints. Pt stable and discharged home ambulatory. Pt to return as scheduled for her next appointment.

## 2018-05-03 DIAGNOSIS — E7849 Other hyperlipidemia: Secondary | ICD-10-CM | POA: Diagnosis not present

## 2018-05-03 DIAGNOSIS — E038 Other specified hypothyroidism: Secondary | ICD-10-CM | POA: Diagnosis not present

## 2018-08-04 DIAGNOSIS — R82998 Other abnormal findings in urine: Secondary | ICD-10-CM | POA: Diagnosis not present

## 2018-08-04 DIAGNOSIS — E038 Other specified hypothyroidism: Secondary | ICD-10-CM | POA: Diagnosis not present

## 2018-08-04 DIAGNOSIS — E7849 Other hyperlipidemia: Secondary | ICD-10-CM | POA: Diagnosis not present

## 2018-08-04 DIAGNOSIS — M859 Disorder of bone density and structure, unspecified: Secondary | ICD-10-CM | POA: Diagnosis not present

## 2018-08-11 DIAGNOSIS — C50919 Malignant neoplasm of unspecified site of unspecified female breast: Secondary | ICD-10-CM | POA: Diagnosis not present

## 2018-08-11 DIAGNOSIS — E038 Other specified hypothyroidism: Secondary | ICD-10-CM | POA: Diagnosis not present

## 2018-08-11 DIAGNOSIS — R011 Cardiac murmur, unspecified: Secondary | ICD-10-CM | POA: Diagnosis not present

## 2018-08-11 DIAGNOSIS — I444 Left anterior fascicular block: Secondary | ICD-10-CM | POA: Diagnosis not present

## 2018-08-11 DIAGNOSIS — J3800 Paralysis of vocal cords and larynx, unspecified: Secondary | ICD-10-CM | POA: Diagnosis not present

## 2018-08-11 DIAGNOSIS — Z0000 Encounter for general adult medical examination without abnormal findings: Secondary | ICD-10-CM | POA: Diagnosis not present

## 2018-08-11 DIAGNOSIS — E8940 Asymptomatic postprocedural ovarian failure: Secondary | ICD-10-CM | POA: Diagnosis not present

## 2018-08-11 DIAGNOSIS — Z6829 Body mass index (BMI) 29.0-29.9, adult: Secondary | ICD-10-CM | POA: Diagnosis not present

## 2018-08-11 DIAGNOSIS — E7849 Other hyperlipidemia: Secondary | ICD-10-CM | POA: Diagnosis not present

## 2018-08-11 DIAGNOSIS — Z1331 Encounter for screening for depression: Secondary | ICD-10-CM | POA: Diagnosis not present

## 2018-08-11 DIAGNOSIS — E210 Primary hyperparathyroidism: Secondary | ICD-10-CM | POA: Diagnosis not present

## 2018-08-18 DIAGNOSIS — Z1212 Encounter for screening for malignant neoplasm of rectum: Secondary | ICD-10-CM | POA: Diagnosis not present

## 2018-09-06 DIAGNOSIS — H04123 Dry eye syndrome of bilateral lacrimal glands: Secondary | ICD-10-CM | POA: Diagnosis not present

## 2018-09-06 DIAGNOSIS — H5203 Hypermetropia, bilateral: Secondary | ICD-10-CM | POA: Diagnosis not present

## 2018-09-06 DIAGNOSIS — M3501 Sicca syndrome with keratoconjunctivitis: Secondary | ICD-10-CM | POA: Diagnosis not present

## 2018-09-06 DIAGNOSIS — H25013 Cortical age-related cataract, bilateral: Secondary | ICD-10-CM | POA: Diagnosis not present

## 2018-10-23 DIAGNOSIS — D225 Melanocytic nevi of trunk: Secondary | ICD-10-CM | POA: Diagnosis not present

## 2018-10-23 DIAGNOSIS — Z85820 Personal history of malignant melanoma of skin: Secondary | ICD-10-CM | POA: Diagnosis not present

## 2018-10-23 DIAGNOSIS — L218 Other seborrheic dermatitis: Secondary | ICD-10-CM | POA: Diagnosis not present

## 2018-10-23 DIAGNOSIS — L821 Other seborrheic keratosis: Secondary | ICD-10-CM | POA: Diagnosis not present

## 2018-10-23 DIAGNOSIS — D2262 Melanocytic nevi of left upper limb, including shoulder: Secondary | ICD-10-CM | POA: Diagnosis not present

## 2018-10-23 DIAGNOSIS — D2271 Melanocytic nevi of right lower limb, including hip: Secondary | ICD-10-CM | POA: Diagnosis not present

## 2018-10-23 DIAGNOSIS — D2272 Melanocytic nevi of left lower limb, including hip: Secondary | ICD-10-CM | POA: Diagnosis not present

## 2018-10-23 DIAGNOSIS — D485 Neoplasm of uncertain behavior of skin: Secondary | ICD-10-CM | POA: Diagnosis not present

## 2018-10-23 DIAGNOSIS — L810 Postinflammatory hyperpigmentation: Secondary | ICD-10-CM | POA: Diagnosis not present

## 2018-12-28 ENCOUNTER — Other Ambulatory Visit: Payer: Self-pay | Admitting: Internal Medicine

## 2018-12-28 DIAGNOSIS — Z1231 Encounter for screening mammogram for malignant neoplasm of breast: Secondary | ICD-10-CM

## 2019-02-08 ENCOUNTER — Other Ambulatory Visit: Payer: Self-pay

## 2019-02-08 ENCOUNTER — Ambulatory Visit
Admission: RE | Admit: 2019-02-08 | Discharge: 2019-02-08 | Disposition: A | Discharge status: Home / Self Care | Payer: Medicare Other | Source: Ambulatory Visit | Attending: Internal Medicine | Admitting: Internal Medicine

## 2019-02-08 DIAGNOSIS — Z1231 Encounter for screening mammogram for malignant neoplasm of breast: Secondary | ICD-10-CM | POA: Diagnosis not present

## 2019-03-13 DIAGNOSIS — Z800 Family history of malignant neoplasm of digestive organs: Secondary | ICD-10-CM | POA: Diagnosis not present

## 2019-03-13 DIAGNOSIS — N390 Urinary tract infection, site not specified: Secondary | ICD-10-CM | POA: Diagnosis not present

## 2019-03-13 DIAGNOSIS — Z853 Personal history of malignant neoplasm of breast: Secondary | ICD-10-CM | POA: Diagnosis not present

## 2019-03-13 DIAGNOSIS — C50919 Malignant neoplasm of unspecified site of unspecified female breast: Secondary | ICD-10-CM | POA: Diagnosis not present

## 2019-03-13 DIAGNOSIS — M25551 Pain in right hip: Secondary | ICD-10-CM | POA: Diagnosis not present

## 2019-03-13 DIAGNOSIS — Z803 Family history of malignant neoplasm of breast: Secondary | ICD-10-CM | POA: Diagnosis not present

## 2019-03-13 DIAGNOSIS — Z8371 Family history of colonic polyps: Secondary | ICD-10-CM | POA: Diagnosis not present

## 2019-03-13 DIAGNOSIS — Z6831 Body mass index (BMI) 31.0-31.9, adult: Secondary | ICD-10-CM | POA: Diagnosis not present

## 2019-03-13 DIAGNOSIS — Z801 Family history of malignant neoplasm of trachea, bronchus and lung: Secondary | ICD-10-CM | POA: Diagnosis not present

## 2019-03-13 DIAGNOSIS — Z01419 Encounter for gynecological examination (general) (routine) without abnormal findings: Secondary | ICD-10-CM | POA: Diagnosis not present

## 2019-03-15 DIAGNOSIS — M545 Low back pain: Secondary | ICD-10-CM | POA: Diagnosis not present

## 2019-03-15 DIAGNOSIS — M25551 Pain in right hip: Secondary | ICD-10-CM | POA: Diagnosis not present

## 2019-03-15 DIAGNOSIS — M25511 Pain in right shoulder: Secondary | ICD-10-CM | POA: Diagnosis not present

## 2019-03-29 DIAGNOSIS — M19011 Primary osteoarthritis, right shoulder: Secondary | ICD-10-CM | POA: Diagnosis not present

## 2019-04-26 ENCOUNTER — Other Ambulatory Visit: Payer: Self-pay

## 2019-04-27 ENCOUNTER — Inpatient Hospital Stay (HOSPITAL_COMMUNITY): Payer: Medicare Other | Attending: Hematology | Admitting: Hematology

## 2019-04-27 ENCOUNTER — Ambulatory Visit (HOSPITAL_COMMUNITY): Payer: Medicare Other | Admitting: Hematology

## 2019-04-27 VITALS — BP 127/83 | HR 74 | Temp 97.9°F | Resp 18 | Wt 189.0 lb

## 2019-04-27 DIAGNOSIS — Z0000 Encounter for general adult medical examination without abnormal findings: Secondary | ICD-10-CM

## 2019-04-27 DIAGNOSIS — Z23 Encounter for immunization: Secondary | ICD-10-CM | POA: Diagnosis not present

## 2019-04-27 DIAGNOSIS — C50911 Malignant neoplasm of unspecified site of right female breast: Secondary | ICD-10-CM

## 2019-04-27 MED ORDER — INFLUENZA VAC A&B SA ADJ QUAD 0.5 ML IM PRSY
0.5000 mL | PREFILLED_SYRINGE | Freq: Once | INTRAMUSCULAR | Status: AC
  Administered 2019-04-27: 0.5 mL via INTRAMUSCULAR
  Filled 2019-04-27: qty 0.5

## 2019-04-30 DIAGNOSIS — N3000 Acute cystitis without hematuria: Secondary | ICD-10-CM | POA: Diagnosis not present

## 2019-04-30 DIAGNOSIS — R351 Nocturia: Secondary | ICD-10-CM | POA: Diagnosis not present

## 2019-04-30 DIAGNOSIS — N952 Postmenopausal atrophic vaginitis: Secondary | ICD-10-CM | POA: Diagnosis not present

## 2019-05-03 DIAGNOSIS — C50919 Malignant neoplasm of unspecified site of unspecified female breast: Secondary | ICD-10-CM | POA: Diagnosis not present

## 2019-05-03 DIAGNOSIS — Z1211 Encounter for screening for malignant neoplasm of colon: Secondary | ICD-10-CM | POA: Diagnosis not present

## 2019-06-14 NOTE — Assessment & Plan Note (Signed)
1.  Stage I (T1b N0) right breast IDC ER positive, PR negative, HER-2 negative: - Diagnosed on 11/13/2008, reexcision on 12/25/2008, status post radiation therapy. - Oncotype DX recurrence score of 21, recurrence around 13% at 10 years after therapy with tamoxifen. - Anastrozole started on 07/20/2008, completed 5 years. - Today's examination did not reveal any palpable masses.  Lumpectomy site is within normal limits on the right breast.  No palpable adenopathy. -I have reviewed mammogram dated 02/08/2019, negative for any evidence of disease, BI-RADS Category 2 -She will return to clinic in 1 year or sooner if needed.

## 2019-06-14 NOTE — Progress Notes (Signed)
Kelsey Andrews, Kelsey Andrews   CLINIC:  Medical Oncology/Hematology  PCP:  Crist Infante, [place] [address] [phone]   REASON FOR VISIT:  Follow-up for breast cancer  CURRENT THERAPY: Clinical surveillance  BRIEF ONCOLOGIC HISTORY:  Oncology History  Invasive ductal carcinoma of right breast, stage 1 ([place])  11/13/2008 Initial Diagnosis   Core biopsy of right breast showing DCIS   12/10/2008 Surgery   Lumpectomy of right breast- demonstrating invasive ductal carcinoma, 0.7 cm, grade II, with positive DCIS at the posterior marked margin.  Negative sentinel lymph node biopsy   12/26/2008 Surgery   Repeat resection to clear margins   01/30/2009 - 03/19/2009 Radiation Therapy   Dr. Pablo Ledger   03/20/2009 - 03/27/2014 Chemotherapy   Approximate start date of Arimidex.  Will complete 5 years worth of therapy in September 2015      CANCER STAGING: Cancer Staging Invasive ductal carcinoma of right breast, stage 1 (HCC) Staging form: Breast, AJCC 7th Edition - Clinical: Stage IA (T1b, N0, cM0) - Signed by Baird Cancer, PA on 11/08/2011    INTERVAL HISTORY:  Kelsey Andrews 68 y.o. female presents today for follow-up.  She reports overall doing well.  She denies any changes in her breasts.  No new lumps, masses, nipple inversion or discharge.  No change in bowel habits.  Appetite is stable.  No weight loss.  She denies any major changes in her health history since her last visit.  She is here for repeat labs and follow-up.   REVIEW OF SYSTEMS:  Review of Systems  Constitutional: Negative.   HENT:  Negative.   Eyes: Negative.   Respiratory: Negative.   Cardiovascular: Negative.   Gastrointestinal: Negative.   Endocrine: Negative.   Genitourinary: Negative.    Musculoskeletal: Positive for arthralgias.  Skin: Negative.   Neurological: Negative.   Hematological: Negative.   Psychiatric/Behavioral: Negative.       PAST MEDICAL/SURGICAL HISTORY:  Past Medical History:  Diagnosis Date  . Adenomatous colon polyp   . Arthritis   . Breast cancer ([place]) 2010   rt. RADIATION DONE  . Invasive ductal carcinoma of right breast, stage 1 ([place]) 08/25/2011   Started Arimidex on 07/20/2008.  Stage I (T1b N0) intermediate grade invasive ductal carcinoma of the right breast with biopsy on 11/13/2008, re-excision on 12/25/2008 with no further invasive disease found. 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 Her invasive disease was ER positive at 10  . Melanoma of shoulder ([place])   . Osteopenia 04/02/2014  . Personal history of radiation therapy 2010  . Primary hyperparathyroidism ([place])   . Thyroid activity decreased    Past Surgical History:  Procedure Laterality Date  . ABDOMINAL HYSTERECTOMY    . BREAST BIOPSY Right 2010   Malignant Stereo Biopsy   . BREAST LUMPECTOMY Right 2010 X 2   rt. WITH LYMPH NODE REMOVAL  . BTL    . carpel tunnel     both  . COLONOSCOPY    . LAPAROSCOPIC TOTAL HYSTERECTOMY    . PARATHYROIDECTOMY Right 09/03/2016   Procedure: RIGHT INFERIOR PARATHYROIDECTOMY;  Surgeon: Armandina Gemma, MD;  Location: [facility];  Service: General;  Laterality: Right;  . removal of melanoma     Rt shoulder  . TISSUE EXPANDER  REMOVAL W/ REPLACEMENT OF IMPLANT     rt. breast  tissue removed  . tonscellectomy       SOCIAL HISTORY:  Social History   Socioeconomic History  . Marital status: Married    Spouse name: Not on file  . Number of children: Not on file  . Years of education: Not on file  . Highest education level: Not on file  Occupational History  . Not on file  Tobacco Use  . Smoking status: Never Smoker  . Smokeless tobacco: Never Used  Substance and Sexual Activity  . Alcohol use: No  . Drug use: No  . Sexual activity: Not on file  Other Topics Concern  . Not on file   Social History Narrative  . Not on file   Social Determinants of Health   Financial Resource Strain:   . Difficulty of Paying Living Expenses: Not on file  Food Insecurity:   . Worried About Charity fundraiser in the Last Year: Not on file  . Ran Out of Food in the Last Year: Not on file  Transportation Needs:   . Lack of Transportation (Medical): Not on file  . Lack of Transportation (Non-Medical): Not on file  Physical Activity:   . Days of Exercise per Week: Not on file  . Minutes of Exercise per Session: Not on file  Stress:   . Feeling of Stress : Not on file  Social Connections:   . Frequency of Communication with Friends and Family: Not on file  . Frequency of Social Gatherings with Friends and Family: Not on file  . Attends Religious Services: Not on file  . Active Member of Clubs or Organizations: Not on file  . Attends Archivist Meetings: Not on file  . Marital Status: Not on file  Intimate Partner Violence:   . Fear of Current or Ex-Partner: Not on file  . Emotionally Abused: Not on file  . Physically Abused: Not on file  . Sexually Abused: Not on file    FAMILY HISTORY:  Family History  Problem Relation Age of Onset  . Colon cancer Maternal Grandmother   . Colon polyps Mother   . Breast cancer Maternal Aunt   . Breast cancer Maternal Aunt     CURRENT MEDICATIONS:  Outpatient Encounter Medications as of 04/27/2019  Medication Sig Note  . aspirin 81 MG EC tablet Take 81 mg by mouth daily.     . Cholecalciferol (VITAMIN D-3) 5000 units TABS Take 5,000 Units by mouth daily.   . hydrOXYzine (ATARAX) 10 MG tablet Take 10 mg by mouth at bedtime.    Marland Kitchen levothyroxine (SYNTHROID, LEVOTHROID) 137 MCG tablet Take 137 mcg by mouth at bedtime.    . Multiple Vitamins-Minerals (MULTIVITAMIN WITH MINERALS) tablet Take 1 tablet by mouth daily.     Marland Kitchen PREVIDENT 5000 DRY MOUTH 1.1 % GEL dental gel Place 1 application onto teeth See admin instructions. 2 - 3 times  daily 08/24/2013: Received from: External Pharmacy  . ALPRAZolam (XANAX) 0.5 MG tablet Take 0.5 mg by mouth daily as needed for anxiety or sleep.  04/08/2016: Received from: External Pharmacy  . b complex vitamins tablet Take 1 tablet by mouth daily.   Marland Kitchen HYDROcodone-acetaminophen (NORCO/VICODIN) 5-325 MG tablet Take 1-2 tablets by mouth every 4 (four) hours as needed for moderate pain. (Patient not taking: Reported on 04/21/2018)   . ibuprofen (ADVIL,MOTRIN) 200 MG tablet Take 400 mg by mouth every 6 (six) hours as needed for headache or mild pain.   . Omega-3 Fatty Acids (FISH OIL PO)  Take 1 capsule by mouth daily.   . [EXPIRED] influenza vaccine adjuvanted (FLUAD) injection 0.5 mL     No facility-administered encounter medications on file as of 04/27/2019.    ALLERGIES:  Allergies  Allergen Reactions  . Morphine And Related Nausea Only  . Penicillins Hives    Has patient had a PCN reaction causing immediate rash, facial/tongue/throat swelling, SOB or lightheadedness with hypotension: unknown Has patient had a PCN reaction causing severe rash involving mucus membranes or skin necrosis: unknown Has patient had a PCN reaction that required hospitalization No Has patient had a PCN reaction occurring within the last 10 years: No If all of the above answers are "NO", then may proceed with Cephalosporin use.      PHYSICAL EXAM:  ECOG Performance status: 1  Vitals:   04/27/19 1107  BP: 127/83  Pulse: 74  Resp: 18  Temp: 97.9 F (36.6 C)  SpO2: 95%   Filed Weights   04/27/19 1107  Weight: 189 lb (85.7 kg)    Physical Exam Constitutional:      Appearance: Normal appearance. She is obese.  HENT:     Head: Normocephalic.     Right Ear: External ear normal.     Left Ear: External ear normal.     Nose: Nose normal.  Eyes:     Conjunctiva/sclera: Conjunctivae normal.  Cardiovascular:     Rate and Rhythm: Normal rate and regular rhythm.     Pulses: Normal pulses.     Heart  sounds: Normal heart sounds.  Pulmonary:     Effort: Pulmonary effort is normal.     Breath sounds: Normal breath sounds.  Abdominal:     General: Bowel sounds are normal.  Musculoskeletal:        General: Normal range of motion.     Cervical back: Normal range of motion.  Skin:    General: Skin is warm.  Neurological:     General: No focal deficit present.     Mental Status: She is alert and oriented to person, place, and time.  Psychiatric:        Mood and Affect: Mood normal.        Behavior: Behavior normal.      LABORATORY DATA:  I have reviewed the labs as listed.  CBC    Component Value Date/Time   WBC 8.1 08/23/2016 1421   RBC 4.78 08/23/2016 1421   HGB 14.2 08/23/2016 1421   HCT 41.0 08/23/2016 1421   PLT 302 08/23/2016 1421   MCV 85.8 08/23/2016 1421   MCH 29.7 08/23/2016 1421   MCHC 34.6 08/23/2016 1421   RDW 13.5 08/23/2016 1421   LYMPHSABS 2.0 04/05/2014 1550   MONOABS 0.5 04/05/2014 1550   EOSABS 0.3 04/05/2014 1550   BASOSABS 0.1 04/05/2014 1550   CMP Latest Ref Rng & Units 08/23/2016 04/05/2014 08/09/2012  Glucose 65 - 99 mg/dL 134(H) 103(H) 99  BUN 6 - 20 mg/dL 15 16 11   Creatinine 0.44 - 1.00 mg/dL 0.71 0.70 0.77  Sodium 135 - 145 mmol/L 141 140 139  Potassium 3.5 - 5.1 mmol/L 4.2 4.0 4.8  Chloride 101 - 111 mmol/L 107 102 102  CO2 22 - 32 mmol/L 27 24 28   Calcium 8.9 - 10.3 mg/dL 10.4(H) 10.2 10.4  Total Protein 6.0 - 8.3 g/dL - 7.5 7.2  Total Bilirubin 0.3 - 1.2 mg/dL - 0.3 0.4  Alkaline Phos 39 - 117 U/L - 104 100  AST 0 - 37 U/L - 38(H)  32  ALT 0 - 35 U/L - 40(H) 36(H)        ASSESSMENT & PLAN:   Invasive ductal carcinoma of right breast, stage 1 (HCC) 1.  Stage I (T1b N0) right breast IDC ER positive, PR negative, HER-2 negative: - Diagnosed on 11/13/2008, reexcision on 12/25/2008, status post radiation therapy. - Oncotype DX recurrence score of 21, recurrence around 13% at 10 years after therapy with tamoxifen. - Anastrozole started  on 07/20/2008, completed 5 years. - Today's examination did not reveal any palpable masses.  Lumpectomy site is within normal limits on the right breast.  No palpable adenopathy. -I have reviewed mammogram dated 02/08/2019, negative for any evidence of disease, BI-RADS Category 2 -She will return to clinic in 1 year or sooner if needed.       Orders placed this encounter:  Orders Placed This Encounter  Procedures  . MM Digital Screening      [place] [phone]

## 2019-07-19 DIAGNOSIS — R1310 Dysphagia, unspecified: Secondary | ICD-10-CM | POA: Diagnosis not present

## 2019-07-19 DIAGNOSIS — Z86010 Personal history of colonic polyps: Secondary | ICD-10-CM | POA: Diagnosis not present

## 2019-07-23 DIAGNOSIS — Z1159 Encounter for screening for other viral diseases: Secondary | ICD-10-CM | POA: Diagnosis not present

## 2019-07-26 DIAGNOSIS — K5730 Diverticulosis of large intestine without perforation or abscess without bleeding: Secondary | ICD-10-CM | POA: Diagnosis not present

## 2019-07-26 DIAGNOSIS — K2930 Chronic superficial gastritis without bleeding: Secondary | ICD-10-CM | POA: Diagnosis not present

## 2019-07-26 DIAGNOSIS — K6389 Other specified diseases of intestine: Secondary | ICD-10-CM | POA: Diagnosis not present

## 2019-07-26 DIAGNOSIS — K3189 Other diseases of stomach and duodenum: Secondary | ICD-10-CM | POA: Diagnosis not present

## 2019-07-26 DIAGNOSIS — R1310 Dysphagia, unspecified: Secondary | ICD-10-CM | POA: Diagnosis not present

## 2019-07-26 DIAGNOSIS — K635 Polyp of colon: Secondary | ICD-10-CM | POA: Diagnosis not present

## 2019-07-26 DIAGNOSIS — Z86010 Personal history of colonic polyps: Secondary | ICD-10-CM | POA: Diagnosis not present

## 2019-07-26 DIAGNOSIS — K222 Esophageal obstruction: Secondary | ICD-10-CM | POA: Diagnosis not present

## 2019-07-31 DIAGNOSIS — K635 Polyp of colon: Secondary | ICD-10-CM | POA: Diagnosis not present

## 2019-07-31 DIAGNOSIS — K2930 Chronic superficial gastritis without bleeding: Secondary | ICD-10-CM | POA: Diagnosis not present

## 2019-08-29 DIAGNOSIS — E038 Other specified hypothyroidism: Secondary | ICD-10-CM | POA: Diagnosis not present

## 2019-08-29 DIAGNOSIS — M859 Disorder of bone density and structure, unspecified: Secondary | ICD-10-CM | POA: Diagnosis not present

## 2019-08-29 DIAGNOSIS — E7849 Other hyperlipidemia: Secondary | ICD-10-CM | POA: Diagnosis not present

## 2019-09-05 DIAGNOSIS — M8580 Other specified disorders of bone density and structure, unspecified site: Secondary | ICD-10-CM | POA: Diagnosis not present

## 2019-09-05 DIAGNOSIS — R82998 Other abnormal findings in urine: Secondary | ICD-10-CM | POA: Diagnosis not present

## 2019-09-05 DIAGNOSIS — Z1331 Encounter for screening for depression: Secondary | ICD-10-CM | POA: Diagnosis not present

## 2019-09-05 DIAGNOSIS — E8940 Asymptomatic postprocedural ovarian failure: Secondary | ICD-10-CM | POA: Diagnosis not present

## 2019-09-05 DIAGNOSIS — E785 Hyperlipidemia, unspecified: Secondary | ICD-10-CM | POA: Diagnosis not present

## 2019-09-05 DIAGNOSIS — J3800 Paralysis of vocal cords and larynx, unspecified: Secondary | ICD-10-CM | POA: Diagnosis not present

## 2019-09-05 DIAGNOSIS — Z1339 Encounter for screening examination for other mental health and behavioral disorders: Secondary | ICD-10-CM | POA: Diagnosis not present

## 2019-09-05 DIAGNOSIS — I444 Left anterior fascicular block: Secondary | ICD-10-CM | POA: Diagnosis not present

## 2019-09-05 DIAGNOSIS — R1310 Dysphagia, unspecified: Secondary | ICD-10-CM | POA: Diagnosis not present

## 2019-09-05 DIAGNOSIS — E669 Obesity, unspecified: Secondary | ICD-10-CM | POA: Diagnosis not present

## 2019-09-05 DIAGNOSIS — R682 Dry mouth, unspecified: Secondary | ICD-10-CM | POA: Diagnosis not present

## 2019-09-05 DIAGNOSIS — Z0000 Encounter for general adult medical examination without abnormal findings: Secondary | ICD-10-CM | POA: Diagnosis not present

## 2019-09-05 DIAGNOSIS — R011 Cardiac murmur, unspecified: Secondary | ICD-10-CM | POA: Diagnosis not present

## 2019-09-05 DIAGNOSIS — R32 Unspecified urinary incontinence: Secondary | ICD-10-CM | POA: Diagnosis not present

## 2019-09-18 DIAGNOSIS — Z1212 Encounter for screening for malignant neoplasm of rectum: Secondary | ICD-10-CM | POA: Diagnosis not present

## 2019-11-07 IMAGING — MG DIGITAL SCREENING BILATERAL MAMMOGRAM WITH TOMO AND CAD
6 of 10 series · 6 of 30 positions shown · non-contrast
Comparison: Previous exam(s).

CLINICAL DATA: Screening.

EXAM:
DIGITAL SCREENING BILATERAL MAMMOGRAM WITH TOMO AND CAD

[R MLO synth-2D (1 of 2)]
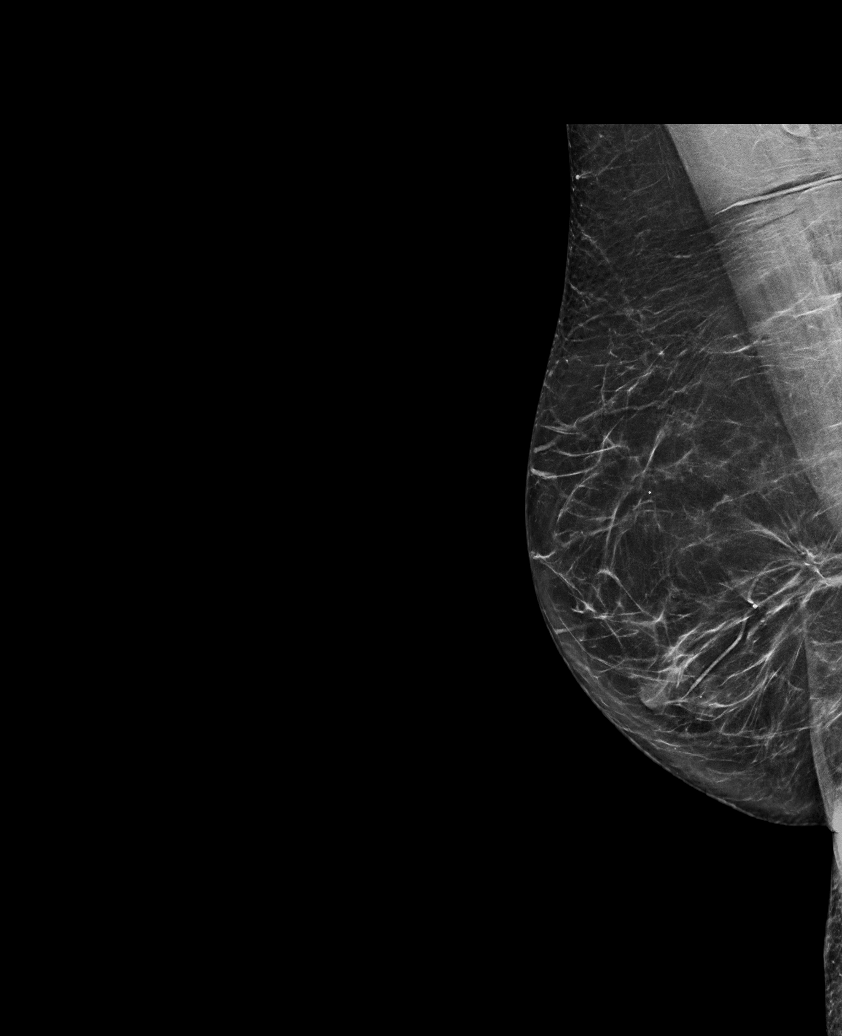

[R CC synth-2D]
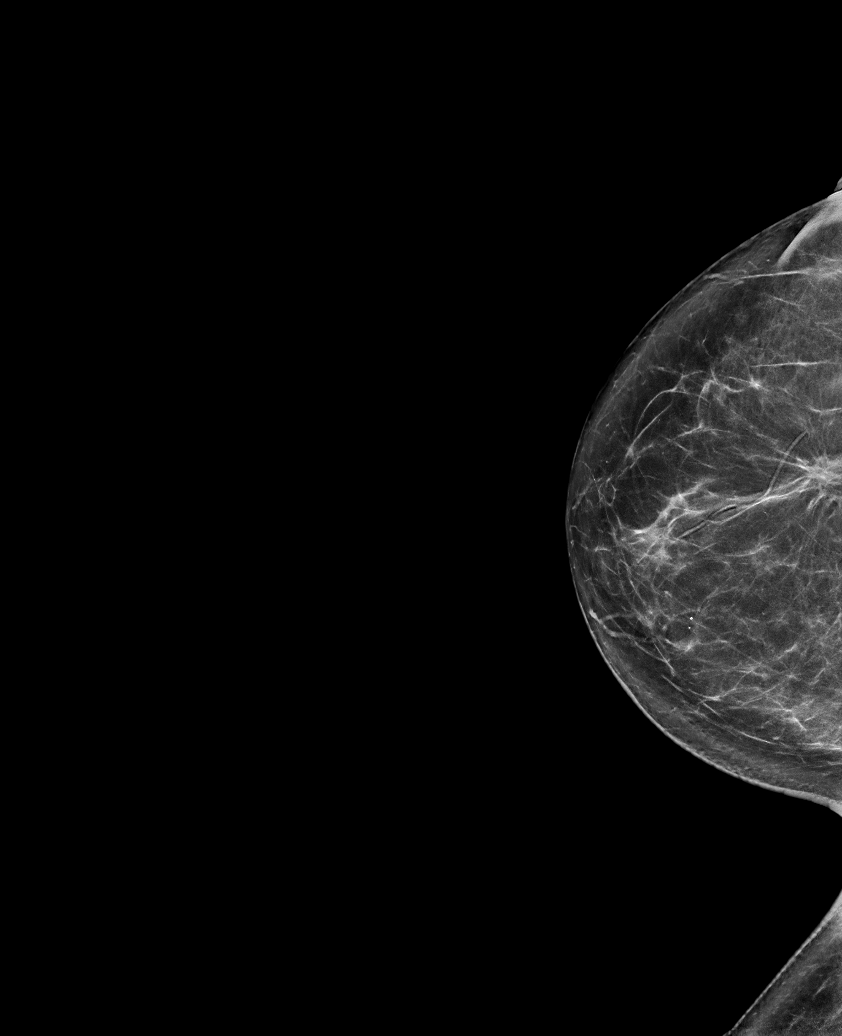

[L MLO synth-2D]
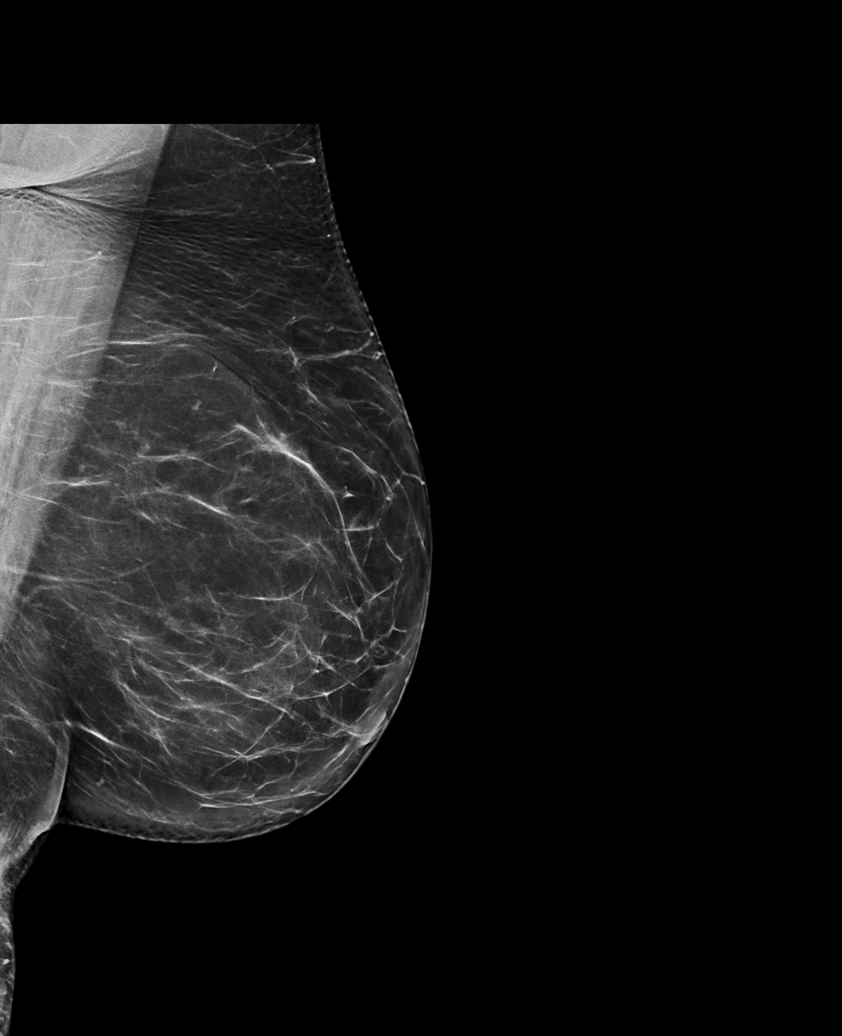

[L CC synth-2D]
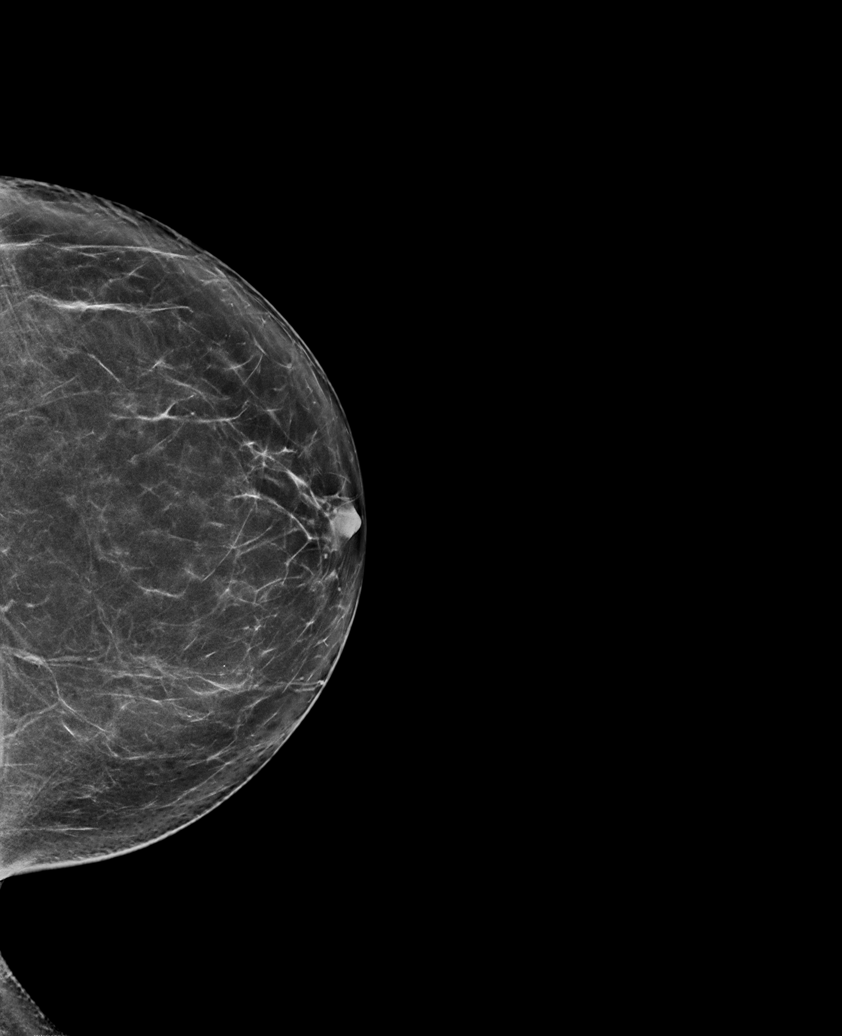

[R MLO synth-2D (2 of 2)]
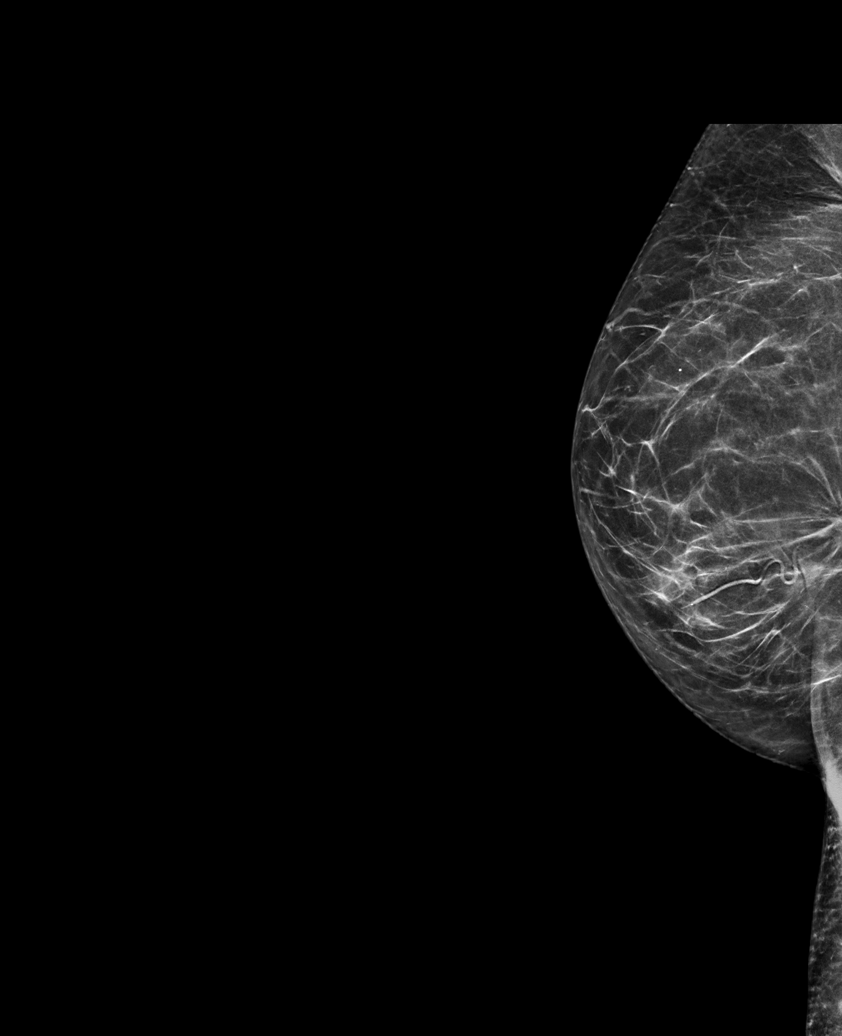

[R CC tomo · tomo slice 37/74.0]
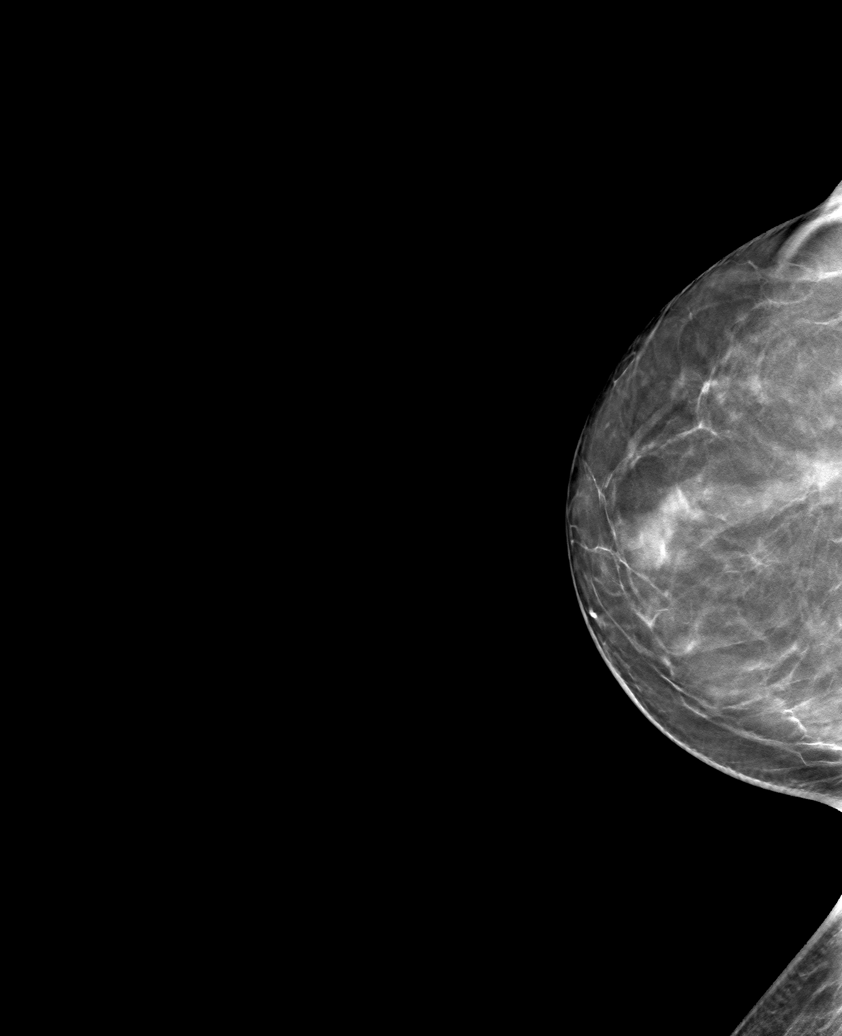

[6 of 30 positions shown; findings below may reference images not displayed]

ACR Breast Density Category b: There are scattered areas of
fibroglandular density.
FINDINGS: There are no findings suspicious for malignancy. Stable post
lumpectomy changes on the right. Images were processed with CAD.
IMPRESSION: No mammographic evidence of malignancy. A result letter of this
screening mammogram will be mailed directly to the patient.

RECOMMENDATION:
Screening mammogram in one year. (Code:Y5-A-1EO)

BI-RADS CATEGORY  2: Benign.

## 2019-11-21 DIAGNOSIS — D2272 Melanocytic nevi of left lower limb, including hip: Secondary | ICD-10-CM | POA: Diagnosis not present

## 2019-11-21 DIAGNOSIS — Z85820 Personal history of malignant melanoma of skin: Secondary | ICD-10-CM | POA: Diagnosis not present

## 2019-11-21 DIAGNOSIS — D2262 Melanocytic nevi of left upper limb, including shoulder: Secondary | ICD-10-CM | POA: Diagnosis not present

## 2019-11-21 DIAGNOSIS — L821 Other seborrheic keratosis: Secondary | ICD-10-CM | POA: Diagnosis not present

## 2019-11-21 DIAGNOSIS — D225 Melanocytic nevi of trunk: Secondary | ICD-10-CM | POA: Diagnosis not present

## 2019-11-21 DIAGNOSIS — L570 Actinic keratosis: Secondary | ICD-10-CM | POA: Diagnosis not present

## 2019-11-21 DIAGNOSIS — D2271 Melanocytic nevi of right lower limb, including hip: Secondary | ICD-10-CM | POA: Diagnosis not present

## 2019-11-21 DIAGNOSIS — Q828 Other specified congenital malformations of skin: Secondary | ICD-10-CM | POA: Diagnosis not present

## 2020-01-02 ENCOUNTER — Other Ambulatory Visit: Payer: Self-pay | Admitting: Internal Medicine

## 2020-01-02 DIAGNOSIS — Z1231 Encounter for screening mammogram for malignant neoplasm of breast: Secondary | ICD-10-CM

## 2020-01-16 DIAGNOSIS — H353131 Nonexudative age-related macular degeneration, bilateral, early dry stage: Secondary | ICD-10-CM | POA: Diagnosis not present

## 2020-01-16 DIAGNOSIS — H43812 Vitreous degeneration, left eye: Secondary | ICD-10-CM | POA: Diagnosis not present

## 2020-02-11 ENCOUNTER — Other Ambulatory Visit: Payer: Self-pay

## 2020-02-11 ENCOUNTER — Ambulatory Visit
Admission: RE | Admit: 2020-02-11 | Discharge: 2020-02-11 | Disposition: A | Discharge status: Home / Self Care | Payer: Medicare Other | Source: Ambulatory Visit | Attending: Internal Medicine | Admitting: Internal Medicine

## 2020-02-11 DIAGNOSIS — Z1231 Encounter for screening mammogram for malignant neoplasm of breast: Secondary | ICD-10-CM | POA: Diagnosis not present

## 2020-02-14 DIAGNOSIS — Z23 Encounter for immunization: Secondary | ICD-10-CM | POA: Diagnosis not present

## 2020-04-09 DIAGNOSIS — Z23 Encounter for immunization: Secondary | ICD-10-CM | POA: Diagnosis not present

## 2020-04-25 ENCOUNTER — Inpatient Hospital Stay (HOSPITAL_COMMUNITY): Payer: Medicare Other | Attending: Oncology | Admitting: Oncology

## 2020-04-25 ENCOUNTER — Other Ambulatory Visit: Payer: Self-pay

## 2020-04-25 VITALS — BP 166/97 | HR 74 | Temp 97.2°F | Resp 18 | Wt 204.9 lb

## 2020-04-25 DIAGNOSIS — C50911 Malignant neoplasm of unspecified site of right female breast: Secondary | ICD-10-CM | POA: Diagnosis not present

## 2020-04-25 DIAGNOSIS — Z923 Personal history of irradiation: Secondary | ICD-10-CM | POA: Insufficient documentation

## 2020-04-25 DIAGNOSIS — Z853 Personal history of malignant neoplasm of breast: Secondary | ICD-10-CM | POA: Insufficient documentation

## 2020-04-25 DIAGNOSIS — Z23 Encounter for immunization: Secondary | ICD-10-CM | POA: Insufficient documentation

## 2020-04-25 MED ORDER — INFLUENZA VAC A&B SA ADJ QUAD 0.5 ML IM PRSY
0.5000 mL | PREFILLED_SYRINGE | Freq: Once | INTRAMUSCULAR | Status: AC
  Administered 2020-04-25: 0.5 mL via INTRAMUSCULAR
  Filled 2020-04-25: qty 0.5

## 2020-04-25 NOTE — Progress Notes (Signed)
Kelsey Andrews, [place] [id number]   CLINIC:  Medical Oncology/Hematology  PCP:  Crist Infante, [place] [address] [phone]   REASON FOR VISIT:  Follow-up for breast cancer  CURRENT THERAPY: Clinical surveillance  BRIEF ONCOLOGIC HISTORY:  Oncology History  Invasive ductal carcinoma of right breast, stage 1 ([facility])  11/13/2008 Initial Diagnosis   Core biopsy of right breast showing DCIS   12/10/2008 Surgery   Lumpectomy of right breast- demonstrating invasive ductal carcinoma, 0.7 cm, grade II, with positive DCIS at the posterior marked margin.  Negative sentinel lymph node biopsy   12/26/2008 Surgery   Repeat resection to clear margins   01/30/2009 - 03/19/2009 Radiation Therapy   Dr. Pablo Ledger   03/20/2009 - 03/27/2014 Chemotherapy   Approximate start date of Arimidex.  Will complete 5 years worth of therapy in September 2015      CANCER STAGING: Cancer Staging Invasive ductal carcinoma of right breast, stage 1 (HCC) Staging form: Breast, AJCC 7th Edition - Clinical: Stage IA (T1b, N0, cM0) - Signed by Baird Cancer, PA on 11/08/2011    INTERVAL HISTORY:  [name] 69 y.o. female presents today for follow-up. She reports overall feeling well. She denies any new lumps, masses, nipple inversion or discharge. She recently had a mammogram and colonoscopy. She denies any appetite changes or weight loss. Denies any B symptoms. Has intermittent shortness of breath with exertion. Has occasional sleep problems. Has no bowel problems. Energy levels are stable her appetite is great.  REVIEW OF SYSTEMS:  Review of Systems  Constitutional: Negative.   HENT:  Negative.   Eyes: Negative.   Respiratory: Negative.   Cardiovascular: Negative.   Gastrointestinal: Negative.   Endocrine: Negative.   Genitourinary: Negative.    Musculoskeletal: Positive for arthralgias.  Skin: Negative.   Neurological: Negative.    Hematological: Negative.   Psychiatric/Behavioral: Negative.      PAST MEDICAL/SURGICAL HISTORY:  Past Medical History:  Diagnosis Date  . Adenomatous colon polyp   . Arthritis   . Breast cancer ([facility]) 2010   rt. RADIATION DONE  . Invasive ductal carcinoma of right breast, stage 1 (HCC)    Started Arimidex on 07/20/2008.  Stage I (T1b N0) intermediate grade invasive ductal carcinoma of the right breast with biopsy on 11/13/2008, re-excision on 12/25/2008 with no further invasive disease found. 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 Her invasive disease was ER positive at 10  . Melanoma of shoulder ([facility])   . Osteopenia 04/02/2014  . Personal history of radiation therapy 2010  . Primary hyperparathyroidism ([facility])   . Thyroid activity decreased    Past Surgical History:  Procedure Laterality Date  . ABDOMINAL HYSTERECTOMY    . BREAST BIOPSY Right 2010   Malignant Stereo Biopsy   . BREAST LUMPECTOMY Right 2010 X 2   rt. WITH LYMPH NODE REMOVAL  . BTL    . carpel tunnel     both  . COLONOSCOPY    . LAPAROSCOPIC TOTAL HYSTERECTOMY    . PARATHYROIDECTOMY Right 09/03/2016   Procedure: RIGHT INFERIOR PARATHYROIDECTOMY;  Surgeon: Armandina Gemma, MD;  Location: [facility];  Service: General;  Laterality: Right;  . removal of melanoma     Rt shoulder  . TISSUE EXPANDER  REMOVAL W/ REPLACEMENT OF IMPLANT     rt. breast tissue removed  .  tonscellectomy       SOCIAL HISTORY:  Social History   Socioeconomic History  . Marital status: Married    Spouse name: Not on file  . Number of children: Not on file  . Years of education: Not on file  . Highest education level: Not on file  Occupational History  . Not on file  Tobacco Use  . Smoking status: Never Smoker  . Smokeless tobacco: Never Used  Vaping Use  . Vaping Use: Never used  Substance and Sexual Activity  . Alcohol use:  No  . Drug use: No  . Sexual activity: Not on file  Other Topics Concern  . Not on file  Social History Narrative  . Not on file   Social Determinants of Health   Financial Resource Strain:   . Difficulty of Paying Living Expenses: Not on file  Food Insecurity:   . Worried About Charity fundraiser in the Last Year: Not on file  . Ran Out of Food in the Last Year: Not on file  Transportation Needs:   . Lack of Transportation (Medical): Not on file  . Lack of Transportation (Non-Medical): Not on file  Physical Activity:   . Days of Exercise per Week: Not on file  . Minutes of Exercise per Session: Not on file  Stress:   . Feeling of Stress : Not on file  Social Connections:   . Frequency of Communication with Friends and Family: Not on file  . Frequency of Social Gatherings with Friends and Family: Not on file  . Attends Religious Services: Not on file  . Active Member of Clubs or Organizations: Not on file  . Attends Archivist Meetings: Not on file  . Marital Status: Not on file  Intimate Partner Violence:   . Fear of Current or Ex-Partner: Not on file  . Emotionally Abused: Not on file  . Physically Abused: Not on file  . Sexually Abused: Not on file    FAMILY HISTORY:  Family History  Problem Relation Age of Onset  . Colon cancer Maternal Grandmother   . Colon polyps Mother   . Breast cancer Maternal Aunt   . Breast cancer Maternal Aunt     CURRENT MEDICATIONS:  Outpatient Encounter Medications as of 04/25/2020  Medication Sig Note  . atorvastatin (LIPITOR) 10 MG tablet Take 10 mg by mouth daily.   Marland Kitchen b complex vitamins tablet Take 1 tablet by mouth daily.   . Cholecalciferol (VITAMIN D-3) 5000 units TABS Take 5,000 Units by mouth daily.   . hydrOXYzine (ATARAX) 10 MG tablet Take 10 mg by mouth at bedtime.    Marland Kitchen levothyroxine (SYNTHROID, LEVOTHROID) 137 MCG tablet Take 137 mcg by mouth at bedtime.    . Multiple Vitamins-Minerals (MULTIVITAMIN WITH  MINERALS) tablet Take 1 tablet by mouth daily.     Marland Kitchen PREVIDENT 5000 DRY MOUTH 1.1 % GEL dental gel Place 1 application onto teeth See admin instructions. 2 - 3 times daily 08/24/2013: Received from: External Pharmacy  . sulfamethoxazole-trimethoprim (BACTRIM DS) 800-160 MG tablet sulfamethoxazole 800 mg-trimethoprim 160 mg tablet   . ibuprofen (ADVIL,MOTRIN) 200 MG tablet Take 400 mg by mouth every 6 (six) hours as needed for headache or mild pain. (Patient not taking: Reported on 04/25/2020)   . [DISCONTINUED] ALPRAZolam (XANAX) 0.5 MG tablet Take 0.5 mg by mouth daily as needed for anxiety or sleep.  04/08/2016: Received from: External Pharmacy  . [DISCONTINUED] aspirin 81 MG EC tablet Take 81 mg  by mouth daily.     . [DISCONTINUED] HYDROcodone-acetaminophen (NORCO/VICODIN) 5-325 MG tablet Take 1-2 tablets by mouth every 4 (four) hours as needed for moderate pain. (Patient not taking: Reported on 04/21/2018)   . [DISCONTINUED] Omega-3 Fatty Acids (FISH OIL PO) Take 1 capsule by mouth daily.    Facility-Administered Encounter Medications as of 04/25/2020  Medication  . influenza vaccine adjuvanted (FLUAD) injection 0.5 mL    ALLERGIES:  Allergies  Allergen Reactions  . Morphine And Related Nausea Only  . Penicillins Hives    Has patient had a PCN reaction causing immediate rash, facial/tongue/throat swelling, SOB or lightheadedness with hypotension: unknown Has patient had a PCN reaction causing severe rash involving mucus membranes or skin necrosis: unknown Has patient had a PCN reaction that required hospitalization No Has patient had a PCN reaction occurring within the last 10 years: No If all of the above answers are "NO", then may proceed with Cephalosporin use.      PHYSICAL EXAM:  ECOG Performance status: 1  Vitals:   04/25/20 1126  BP: (!) 166/97  Pulse: 74  Resp: 18  Temp: (!) 97.2 F (36.2 C)  SpO2: 94%   Filed Weights   04/25/20 1126  Weight: 204 lb 14.4 oz (92.9  kg)    Physical Exam Constitutional:      Appearance: Normal appearance. She is obese.  HENT:     Head: Normocephalic.     Right Ear: External ear normal.     Left Ear: External ear normal.     Nose: Nose normal.  Eyes:     Conjunctiva/sclera: Conjunctivae normal.  Cardiovascular:     Rate and Rhythm: Normal rate and regular rhythm.     Pulses: Normal pulses.     Heart sounds: Normal heart sounds.  Pulmonary:     Effort: Pulmonary effort is normal.     Breath sounds: Normal breath sounds.  Abdominal:     General: Bowel sounds are normal.  Musculoskeletal:        General: Normal range of motion.     Cervical back: Normal range of motion.  Skin:    General: Skin is warm.  Neurological:     General: No focal deficit present.     Mental Status: She is alert and oriented to person, place, and time.  Psychiatric:        Mood and Affect: Mood normal.        Behavior: Behavior normal.      LABORATORY DATA:  I have reviewed the labs as listed.  CBC    Component Value Date/Time   WBC 8.1 08/23/2016 1421   RBC 4.78 08/23/2016 1421   HGB 14.2 08/23/2016 1421   HCT 41.0 08/23/2016 1421   PLT 302 08/23/2016 1421   MCV 85.8 08/23/2016 1421   MCH 29.7 08/23/2016 1421   MCHC 34.6 08/23/2016 1421   RDW 13.5 08/23/2016 1421   LYMPHSABS 2.0 04/05/2014 1550   MONOABS 0.5 04/05/2014 1550   EOSABS 0.3 04/05/2014 1550   BASOSABS 0.1 04/05/2014 1550   CMP Latest Ref Rng & Units 08/23/2016 04/05/2014 08/09/2012  Glucose 65 - 99 mg/dL 134(H) 103(H) 99  BUN 6 - 20 mg/dL _0 Creatinine 0.44 - 1.00 mg/dL 0.71 0.70 0.77  Sodium 135 - 145 mmol/L 141 140 139  Potassium 3.5 - 5.1 mmol/L 4.2 4.0 4.8  Chloride 101 - 111 mmol/L 107 102 102  CO2 22 - 32 mmol/L _1 Calcium 8.9 -  10.3 mg/dL 10.4(H) 10.2 10.4  Total Protein 6.0 - 8.3 g/dL - 7.5 7.2  Total Bilirubin 0.3 - 1.2 mg/dL - 0.3 0.4  Alkaline Phos 39 - 117 U/L - 104 100  AST 0 - 37 U/L - 38(H) 32  ALT 0 - 35 U/L - 40(H)  36(H)      ASSESSMENT & PLAN:  Invasive ductal carcinoma of right breast, stage 1 (HCC) 1.  Stage I (T1b N0) right breast IDC ER positive, PR negative, HER-2 negative: - Diagnosed on 11/13/2008, reexcision on 12/25/2008, status post radiation therapy. - Oncotype DX recurrence score of 21, recurrence around 13% at 10 years after therapy with tamoxifen. - Anastrozole started on 07/20/2008, completed 5 years. - Today's examination did not reveal any palpable masses.  Lumpectomy site is within normal limits on the right breast.  No palpable adenopathy. -I have reviewed mammogram dated 02/12/20, negative for any evidence of disease, BI-RADS Category 2 -Patient would like to follow-up with PCP with annual mammograms. -Discussed with Dr. Delton Coombes who is agreeable. -Patient is to return to clinic with any concerns.  Disposition: -No additional follow-up needed -Follow-up with PCP for annual mammograms.  Greater than 50% was spent in counseling and coordination of care with this patient including but not limited to discussion of the relevant topics above (See A&P) including, but not limited to diagnosis and management of acute and chronic medical conditions.   No problem-specific Assessment & Plan notes found for this encounter.  Orders placed this encounter:  Orders Placed This Encounter  Procedures  . MM 3D [place] [phone]

## 2020-04-25 NOTE — Progress Notes (Signed)
Kelsey Andrews presents today for injection per the provider's orders.  High dose administration without incident; injection site WNL; see MAR for injection details.  Patient tolerated procedure well and without incident.  No questions or complaints noted at this time.

## 2020-05-06 DIAGNOSIS — L918 Other hypertrophic disorders of the skin: Secondary | ICD-10-CM | POA: Diagnosis not present

## 2020-05-06 DIAGNOSIS — L814 Other melanin hyperpigmentation: Secondary | ICD-10-CM | POA: Diagnosis not present

## 2020-05-06 DIAGNOSIS — Z85820 Personal history of malignant melanoma of skin: Secondary | ICD-10-CM | POA: Diagnosis not present

## 2020-05-06 DIAGNOSIS — L570 Actinic keratosis: Secondary | ICD-10-CM | POA: Diagnosis not present

## 2020-05-06 DIAGNOSIS — L820 Inflamed seborrheic keratosis: Secondary | ICD-10-CM | POA: Diagnosis not present

## 2020-05-06 DIAGNOSIS — L700 Acne vulgaris: Secondary | ICD-10-CM | POA: Diagnosis not present

## 2020-06-10 DIAGNOSIS — J069 Acute upper respiratory infection, unspecified: Secondary | ICD-10-CM | POA: Diagnosis not present

## 2020-06-10 DIAGNOSIS — R059 Cough, unspecified: Secondary | ICD-10-CM | POA: Diagnosis not present

## 2020-06-10 DIAGNOSIS — R6883 Chills (without fever): Secondary | ICD-10-CM | POA: Diagnosis not present

## 2020-06-10 DIAGNOSIS — Z1152 Encounter for screening for COVID-19: Secondary | ICD-10-CM | POA: Diagnosis not present

## 2020-09-08 DIAGNOSIS — E785 Hyperlipidemia, unspecified: Secondary | ICD-10-CM | POA: Diagnosis not present

## 2020-09-11 DIAGNOSIS — G629 Polyneuropathy, unspecified: Secondary | ICD-10-CM | POA: Diagnosis not present

## 2020-09-11 DIAGNOSIS — E785 Hyperlipidemia, unspecified: Secondary | ICD-10-CM | POA: Diagnosis not present

## 2020-09-11 DIAGNOSIS — Z0000 Encounter for general adult medical examination without abnormal findings: Secondary | ICD-10-CM | POA: Diagnosis not present

## 2020-09-11 DIAGNOSIS — R82998 Other abnormal findings in urine: Secondary | ICD-10-CM | POA: Diagnosis not present

## 2020-09-11 DIAGNOSIS — M3500 Sicca syndrome, unspecified: Secondary | ICD-10-CM | POA: Diagnosis not present

## 2020-09-11 DIAGNOSIS — R0609 Other forms of dyspnea: Secondary | ICD-10-CM | POA: Diagnosis not present

## 2020-09-11 DIAGNOSIS — I444 Left anterior fascicular block: Secondary | ICD-10-CM | POA: Diagnosis not present

## 2020-09-11 DIAGNOSIS — M8580 Other specified disorders of bone density and structure, unspecified site: Secondary | ICD-10-CM | POA: Diagnosis not present

## 2020-09-11 DIAGNOSIS — Z1212 Encounter for screening for malignant neoplasm of rectum: Secondary | ICD-10-CM | POA: Diagnosis not present

## 2020-09-11 DIAGNOSIS — E669 Obesity, unspecified: Secondary | ICD-10-CM | POA: Diagnosis not present

## 2020-09-11 DIAGNOSIS — E039 Hypothyroidism, unspecified: Secondary | ICD-10-CM | POA: Diagnosis not present

## 2020-09-11 DIAGNOSIS — E8940 Asymptomatic postprocedural ovarian failure: Secondary | ICD-10-CM | POA: Diagnosis not present

## 2020-09-11 DIAGNOSIS — R011 Cardiac murmur, unspecified: Secondary | ICD-10-CM | POA: Diagnosis not present

## 2020-09-11 DIAGNOSIS — Z1331 Encounter for screening for depression: Secondary | ICD-10-CM | POA: Diagnosis not present

## 2020-10-14 DIAGNOSIS — M8589 Other specified disorders of bone density and structure, multiple sites: Secondary | ICD-10-CM | POA: Diagnosis not present

## 2020-10-14 DIAGNOSIS — Z23 Encounter for immunization: Secondary | ICD-10-CM | POA: Diagnosis not present

## 2020-12-16 ENCOUNTER — Other Ambulatory Visit: Payer: Self-pay | Admitting: Urology

## 2020-12-17 DIAGNOSIS — L918 Other hypertrophic disorders of the skin: Secondary | ICD-10-CM | POA: Diagnosis not present

## 2020-12-17 DIAGNOSIS — L218 Other seborrheic dermatitis: Secondary | ICD-10-CM | POA: Diagnosis not present

## 2020-12-17 DIAGNOSIS — D2272 Melanocytic nevi of left lower limb, including hip: Secondary | ICD-10-CM | POA: Diagnosis not present

## 2020-12-17 DIAGNOSIS — Z85820 Personal history of malignant melanoma of skin: Secondary | ICD-10-CM | POA: Diagnosis not present

## 2020-12-17 DIAGNOSIS — L821 Other seborrheic keratosis: Secondary | ICD-10-CM | POA: Diagnosis not present

## 2020-12-17 DIAGNOSIS — D2271 Melanocytic nevi of right lower limb, including hip: Secondary | ICD-10-CM | POA: Diagnosis not present

## 2020-12-17 DIAGNOSIS — D225 Melanocytic nevi of trunk: Secondary | ICD-10-CM | POA: Diagnosis not present

## 2020-12-17 DIAGNOSIS — L298 Other pruritus: Secondary | ICD-10-CM | POA: Diagnosis not present

## 2020-12-18 NOTE — Telephone Encounter (Signed)
No refills on abx w/o u/a

## 2021-02-26 ENCOUNTER — Ambulatory Visit
Admission: RE | Admit: 2021-02-26 | Discharge: 2021-02-26 | Disposition: A | Discharge status: Home / Self Care | Payer: Medicare Other | Source: Ambulatory Visit | Attending: Oncology | Admitting: Oncology

## 2021-02-26 ENCOUNTER — Other Ambulatory Visit: Payer: Self-pay

## 2021-02-26 DIAGNOSIS — Z1231 Encounter for screening mammogram for malignant neoplasm of breast: Secondary | ICD-10-CM | POA: Diagnosis not present

## 2021-02-26 DIAGNOSIS — E039 Hypothyroidism, unspecified: Secondary | ICD-10-CM | POA: Diagnosis not present

## 2021-02-26 DIAGNOSIS — C50911 Malignant neoplasm of unspecified site of right female breast: Secondary | ICD-10-CM

## 2021-05-07 DIAGNOSIS — Z23 Encounter for immunization: Secondary | ICD-10-CM | POA: Diagnosis not present

## 2021-05-07 DIAGNOSIS — Z20828 Contact with and (suspected) exposure to other viral communicable diseases: Secondary | ICD-10-CM | POA: Diagnosis not present

## 2021-06-06 DIAGNOSIS — Z20828 Contact with and (suspected) exposure to other viral communicable diseases: Secondary | ICD-10-CM | POA: Diagnosis not present

## 2021-07-14 DIAGNOSIS — Z6831 Body mass index (BMI) 31.0-31.9, adult: Secondary | ICD-10-CM | POA: Diagnosis not present

## 2021-07-14 DIAGNOSIS — Z779 Other contact with and (suspected) exposures hazardous to health: Secondary | ICD-10-CM | POA: Diagnosis not present

## 2021-07-14 DIAGNOSIS — C50919 Malignant neoplasm of unspecified site of unspecified female breast: Secondary | ICD-10-CM | POA: Diagnosis not present

## 2021-07-14 DIAGNOSIS — N390 Urinary tract infection, site not specified: Secondary | ICD-10-CM | POA: Diagnosis not present

## 2021-10-01 DIAGNOSIS — E039 Hypothyroidism, unspecified: Secondary | ICD-10-CM | POA: Diagnosis not present

## 2021-10-01 DIAGNOSIS — E785 Hyperlipidemia, unspecified: Secondary | ICD-10-CM | POA: Diagnosis not present

## 2021-10-01 DIAGNOSIS — M8580 Other specified disorders of bone density and structure, unspecified site: Secondary | ICD-10-CM | POA: Diagnosis not present

## 2021-10-09 ENCOUNTER — Other Ambulatory Visit: Payer: Self-pay | Admitting: Internal Medicine

## 2021-10-09 DIAGNOSIS — I444 Left anterior fascicular block: Secondary | ICD-10-CM | POA: Diagnosis not present

## 2021-10-09 DIAGNOSIS — R011 Cardiac murmur, unspecified: Secondary | ICD-10-CM | POA: Diagnosis not present

## 2021-10-09 DIAGNOSIS — M3500 Sicca syndrome, unspecified: Secondary | ICD-10-CM | POA: Diagnosis not present

## 2021-10-09 DIAGNOSIS — G629 Polyneuropathy, unspecified: Secondary | ICD-10-CM | POA: Diagnosis not present

## 2021-10-09 DIAGNOSIS — R682 Dry mouth, unspecified: Secondary | ICD-10-CM | POA: Diagnosis not present

## 2021-10-09 DIAGNOSIS — Z0000 Encounter for general adult medical examination without abnormal findings: Secondary | ICD-10-CM | POA: Diagnosis not present

## 2021-10-09 DIAGNOSIS — E8940 Asymptomatic postprocedural ovarian failure: Secondary | ICD-10-CM | POA: Diagnosis not present

## 2021-10-09 DIAGNOSIS — E785 Hyperlipidemia, unspecified: Secondary | ICD-10-CM

## 2021-10-09 DIAGNOSIS — Z1212 Encounter for screening for malignant neoplasm of rectum: Secondary | ICD-10-CM | POA: Diagnosis not present

## 2021-10-09 DIAGNOSIS — R82998 Other abnormal findings in urine: Secondary | ICD-10-CM | POA: Diagnosis not present

## 2021-11-10 ENCOUNTER — Ambulatory Visit
Admission: RE | Admit: 2021-11-10 | Discharge: 2021-11-10 | Disposition: A | Discharge status: Home / Self Care | Payer: No Typology Code available for payment source | Source: Ambulatory Visit | Attending: Internal Medicine | Admitting: Internal Medicine

## 2021-11-10 DIAGNOSIS — E785 Hyperlipidemia, unspecified: Secondary | ICD-10-CM

## 2021-12-09 DIAGNOSIS — Z6829 Body mass index (BMI) 29.0-29.9, adult: Secondary | ICD-10-CM | POA: Diagnosis not present

## 2021-12-09 DIAGNOSIS — M3500 Sicca syndrome, unspecified: Secondary | ICD-10-CM | POA: Diagnosis not present

## 2021-12-09 DIAGNOSIS — E663 Overweight: Secondary | ICD-10-CM | POA: Diagnosis not present

## 2021-12-09 DIAGNOSIS — M1991 Primary osteoarthritis, unspecified site: Secondary | ICD-10-CM | POA: Diagnosis not present

## 2022-01-19 ENCOUNTER — Other Ambulatory Visit: Payer: Self-pay | Admitting: Internal Medicine

## 2022-01-19 DIAGNOSIS — Z1231 Encounter for screening mammogram for malignant neoplasm of breast: Secondary | ICD-10-CM

## 2022-02-02 DIAGNOSIS — D1721 Benign lipomatous neoplasm of skin and subcutaneous tissue of right arm: Secondary | ICD-10-CM | POA: Diagnosis not present

## 2022-02-02 DIAGNOSIS — D485 Neoplasm of uncertain behavior of skin: Secondary | ICD-10-CM | POA: Diagnosis not present

## 2022-02-02 DIAGNOSIS — D2239 Melanocytic nevi of other parts of face: Secondary | ICD-10-CM | POA: Diagnosis not present

## 2022-02-02 DIAGNOSIS — D2271 Melanocytic nevi of right lower limb, including hip: Secondary | ICD-10-CM | POA: Diagnosis not present

## 2022-02-02 DIAGNOSIS — D225 Melanocytic nevi of trunk: Secondary | ICD-10-CM | POA: Diagnosis not present

## 2022-02-02 DIAGNOSIS — Z85820 Personal history of malignant melanoma of skin: Secondary | ICD-10-CM | POA: Diagnosis not present

## 2022-02-02 DIAGNOSIS — D2262 Melanocytic nevi of left upper limb, including shoulder: Secondary | ICD-10-CM | POA: Diagnosis not present

## 2022-02-02 DIAGNOSIS — L298 Other pruritus: Secondary | ICD-10-CM | POA: Diagnosis not present

## 2022-03-02 ENCOUNTER — Ambulatory Visit
Admission: RE | Admit: 2022-03-02 | Discharge: 2022-03-02 | Disposition: A | Discharge status: Home / Self Care | Payer: Medicare Other | Source: Ambulatory Visit | Attending: Internal Medicine | Admitting: Internal Medicine

## 2022-03-02 DIAGNOSIS — Z1231 Encounter for screening mammogram for malignant neoplasm of breast: Secondary | ICD-10-CM | POA: Diagnosis not present

## 2022-04-08 DIAGNOSIS — M3501 Sicca syndrome with keratoconjunctivitis: Secondary | ICD-10-CM | POA: Diagnosis not present

## 2022-04-08 DIAGNOSIS — E663 Overweight: Secondary | ICD-10-CM | POA: Diagnosis not present

## 2022-04-08 DIAGNOSIS — M1991 Primary osteoarthritis, unspecified site: Secondary | ICD-10-CM | POA: Diagnosis not present

## 2022-04-08 DIAGNOSIS — Z6829 Body mass index (BMI) 29.0-29.9, adult: Secondary | ICD-10-CM | POA: Diagnosis not present

## 2022-05-10 DIAGNOSIS — H04123 Dry eye syndrome of bilateral lacrimal glands: Secondary | ICD-10-CM | POA: Diagnosis not present

## 2022-07-15 DIAGNOSIS — C50919 Malignant neoplasm of unspecified site of unspecified female breast: Secondary | ICD-10-CM | POA: Diagnosis not present

## 2022-07-15 DIAGNOSIS — Z779 Other contact with and (suspected) exposures hazardous to health: Secondary | ICD-10-CM | POA: Diagnosis not present

## 2022-07-15 DIAGNOSIS — Z6829 Body mass index (BMI) 29.0-29.9, adult: Secondary | ICD-10-CM | POA: Diagnosis not present

## 2022-08-18 DIAGNOSIS — M1811 Unilateral primary osteoarthritis of first carpometacarpal joint, right hand: Secondary | ICD-10-CM | POA: Diagnosis not present

## 2022-09-27 DIAGNOSIS — L570 Actinic keratosis: Secondary | ICD-10-CM | POA: Diagnosis not present

## 2022-09-27 DIAGNOSIS — L910 Hypertrophic scar: Secondary | ICD-10-CM | POA: Diagnosis not present

## 2022-09-27 DIAGNOSIS — Z85820 Personal history of malignant melanoma of skin: Secondary | ICD-10-CM | POA: Diagnosis not present

## 2022-10-27 DIAGNOSIS — M1811 Unilateral primary osteoarthritis of first carpometacarpal joint, right hand: Secondary | ICD-10-CM | POA: Diagnosis not present

## 2022-10-28 DIAGNOSIS — E8940 Asymptomatic postprocedural ovarian failure: Secondary | ICD-10-CM | POA: Diagnosis not present

## 2022-10-28 DIAGNOSIS — E785 Hyperlipidemia, unspecified: Secondary | ICD-10-CM | POA: Diagnosis not present

## 2022-10-28 DIAGNOSIS — M8589 Other specified disorders of bone density and structure, multiple sites: Secondary | ICD-10-CM | POA: Diagnosis not present

## 2022-10-28 DIAGNOSIS — R002 Palpitations: Secondary | ICD-10-CM | POA: Diagnosis not present

## 2022-10-28 DIAGNOSIS — E039 Hypothyroidism, unspecified: Secondary | ICD-10-CM | POA: Diagnosis not present

## 2022-10-28 DIAGNOSIS — R7989 Other specified abnormal findings of blood chemistry: Secondary | ICD-10-CM | POA: Diagnosis not present

## 2022-11-17 DIAGNOSIS — G629 Polyneuropathy, unspecified: Secondary | ICD-10-CM | POA: Diagnosis not present

## 2022-11-17 DIAGNOSIS — E039 Hypothyroidism, unspecified: Secondary | ICD-10-CM | POA: Diagnosis not present

## 2022-11-17 DIAGNOSIS — I444 Left anterior fascicular block: Secondary | ICD-10-CM | POA: Diagnosis not present

## 2022-11-17 DIAGNOSIS — I77810 Thoracic aortic ectasia: Secondary | ICD-10-CM | POA: Diagnosis not present

## 2022-11-17 DIAGNOSIS — E669 Obesity, unspecified: Secondary | ICD-10-CM | POA: Diagnosis not present

## 2022-11-17 DIAGNOSIS — Z1212 Encounter for screening for malignant neoplasm of rectum: Secondary | ICD-10-CM | POA: Diagnosis not present

## 2022-11-17 DIAGNOSIS — Z0000 Encounter for general adult medical examination without abnormal findings: Secondary | ICD-10-CM | POA: Diagnosis not present

## 2022-11-17 DIAGNOSIS — R82998 Other abnormal findings in urine: Secondary | ICD-10-CM | POA: Diagnosis not present

## 2022-11-17 DIAGNOSIS — R002 Palpitations: Secondary | ICD-10-CM | POA: Diagnosis not present

## 2022-11-17 DIAGNOSIS — M3500 Sicca syndrome, unspecified: Secondary | ICD-10-CM | POA: Diagnosis not present

## 2022-11-17 DIAGNOSIS — E785 Hyperlipidemia, unspecified: Secondary | ICD-10-CM | POA: Diagnosis not present

## 2022-11-17 DIAGNOSIS — J3800 Paralysis of vocal cords and larynx, unspecified: Secondary | ICD-10-CM | POA: Diagnosis not present

## 2022-11-17 DIAGNOSIS — R1310 Dysphagia, unspecified: Secondary | ICD-10-CM | POA: Diagnosis not present

## 2022-11-17 DIAGNOSIS — M8580 Other specified disorders of bone density and structure, unspecified site: Secondary | ICD-10-CM | POA: Diagnosis not present

## 2022-11-18 ENCOUNTER — Other Ambulatory Visit: Payer: Self-pay | Admitting: Internal Medicine

## 2022-11-18 DIAGNOSIS — I77810 Thoracic aortic ectasia: Secondary | ICD-10-CM

## 2022-12-01 DIAGNOSIS — L608 Other nail disorders: Secondary | ICD-10-CM | POA: Diagnosis not present

## 2022-12-01 DIAGNOSIS — Z85820 Personal history of malignant melanoma of skin: Secondary | ICD-10-CM | POA: Diagnosis not present

## 2022-12-09 ENCOUNTER — Ambulatory Visit (INDEPENDENT_AMBULATORY_CARE_PROVIDER_SITE_OTHER): Payer: Medicare Other | Admitting: Podiatrist

## 2022-12-09 ENCOUNTER — Encounter: Payer: Self-pay | Admitting: Podiatrist

## 2022-12-09 DIAGNOSIS — L814 Other melanin hyperpigmentation: Secondary | ICD-10-CM | POA: Diagnosis not present

## 2022-12-09 DIAGNOSIS — L819 Disorder of pigmentation, unspecified: Secondary | ICD-10-CM | POA: Diagnosis not present

## 2022-12-09 DIAGNOSIS — D2371 Other benign neoplasm of skin of right lower limb, including hip: Secondary | ICD-10-CM | POA: Diagnosis not present

## 2022-12-09 NOTE — Patient Instructions (Signed)
Soak Instructions    THE DAY AFTER THE PROCEDURE  Place 1/4 cup of epsom salts in a quart of warm tap water.  Submerge your foot or feet with outer bandage intact for the initial soak; this will allow the bandage to become moist and wet for easy lift off.  Once you remove your bandage, continue to soak in the solution for 20 minutes.  This soak should be done twice a day.  Next, remove your foot or feet from solution, blot dry the affected area and cover.  Apply polysporin or neosporin.   You may use a band aid large enough to cover the area or use gauze and tape.     IF YOUR SKIN BECOMES IRRITATED WHILE USING THESE INSTRUCTIONS, IT IS OKAY TO SWITCH TO  antibacterial soap pump soap (Dial)  and water to keep the toe clean instead of soaking in epsom salts.    After a week of soaks/ it is ok to keep the toe clean with soap and water-  and cover with a bandaid until soreness has gone away.

## 2022-12-09 NOTE — Progress Notes (Signed)
Chief Complaint  Patient presents with   Nail Problem    Pt is concerned about a black spot on her nail near the nail bed      HPI: Patient is 72 y.o. female who presents today for a dark spot on the right second toenail at the proximal aspect of the nail.  She is denies any trauma or injury to the nail that she can remember.  She relates she has had breast cancer and melanoma in the past and is concerned over the dark area on the toe.  She denies pain denies any recent change in the appearance of the nail that she can remember.  Patient Active Problem List   Diagnosis Date Noted   Hyperparathyroidism, primary (HCC) 09/01/2016   Osteopenia 04/02/2014   Invasive ductal carcinoma of right breast, stage 1 (HCC) 08/25/2011    Current Outpatient Medications on File Prior to Visit  Medication Sig Dispense Refill   atorvastatin (LIPITOR) 10 MG tablet Take 10 mg by mouth daily.     b complex vitamins tablet Take 1 tablet by mouth daily.     Cholecalciferol (VITAMIN D-3) 5000 units TABS Take 5,000 Units by mouth daily.     hydrOXYzine (ATARAX) 10 MG tablet Take 10 mg by mouth at bedtime.      ibuprofen (ADVIL,MOTRIN) 200 MG tablet Take 400 mg by mouth every 6 (six) hours as needed for headache or mild pain. (Patient not taking: Reported on 04/25/2020)     levothyroxine (SYNTHROID, LEVOTHROID) 137 MCG tablet Take 137 mcg by mouth at bedtime.      Multiple Vitamins-Minerals (MULTIVITAMIN WITH MINERALS) tablet Take 1 tablet by mouth daily.       PREVIDENT 5000 DRY MOUTH 1.1 % GEL dental gel Place 1 application onto teeth See admin instructions. 2 - 3 times daily     sulfamethoxazole-trimethoprim (BACTRIM DS) 800-160 MG tablet sulfamethoxazole 800 mg-trimethoprim 160 mg tablet     No current facility-administered medications on file prior to visit.    Allergies  Allergen Reactions   Morphine And Codeine Nausea Only   Penicillins Hives    Has patient had a PCN reaction causing immediate rash,  facial/tongue/throat swelling, SOB or lightheadedness with hypotension: unknown Has patient had a PCN reaction causing severe rash involving mucus membranes or skin necrosis: unknown Has patient had a PCN reaction that required hospitalization No Has patient had a PCN reaction occurring within the last 10 years: No If all of the above answers are "NO", then may proceed with Cephalosporin use.     Review of Systems No fevers, chills, nausea, muscle aches, no difficulty breathing, no calf pain, no chest pain or shortness of breath.   Physical Exam  GENERAL APPEARANCE: Alert, conversant. Appropriately groomed. No acute distress.   VASCULAR: Pedal pulses palpable 2/4 DP and 2/4 PT bilateral.  Capillary refill time is immediate to all digits,  Proximal to distal cooling is warm to warm.  Digital perfusion adequate.   NEUROLOGIC: sensation is intact to 5.07 monofilament at 5/5 sites bilateral.  Light touch is intact bilateral, vibratory sensation intact bilateral  MUSCULOSKELETAL: acceptable muscle strength, tone and stability bilateral.  No gross boney pedal deformities noted.  No pain, crepitus or limitation noted with foot and ankle range of motion bilateral.   DERMATOLOGIC: skin is warm, supple, and dry.  Color, texture, and turgor of skin within normal limits.  Right second toenail has an area of darkening discoloration of the proximal nail fold.  No redness,  no swelling is noted no streaking of the darkened area within the nail is noted.  See photo below.    Assessment    ICD-10-CM   1. Disorder of pigmentation, unspecified  L81.9 Fungus culture w smear       Plan  Discussed exam findings and recommendations.  Because she has no history of trauma and therefore no explanation for the contusion within the nailbed I recommended removing the toenail and sending the nail for pathology to rule out melanoma.  The patient agreed and would like to proceed.  I prepped the skin with alcohol  and infiltrated a one-to-one mix of 0.5% Marcaine plain and 2% lidocaine plain in digital block fashion.  The toe was then prepped with Betadine and exsanguinated and the second digital nail was then carefully removed with a Therapist, nutritional.  Of note the underlying nailbed had no sign of dark lesion or discoloration.  The discoloration is in the toenail itself.  This was then sent in formalin to [facility] labs.  I will notify her of the results as soon as they are available.  I applied antibiotic ointment and a dry sterile compressive dressing gave instructions for soaks and aftercare.  If any problems arise she will call otherwise I will follow-up with the results of the study.

## 2022-12-20 ENCOUNTER — Telehealth: Payer: Self-pay | Admitting: Podiatrist

## 2022-12-20 DIAGNOSIS — L608 Other nail disorders: Secondary | ICD-10-CM

## 2022-12-20 NOTE — Telephone Encounter (Signed)
Called Kelsey Andrews with result of nail pathology sent- right second toenail.  Reported as melanotic macule of matrix.  No features to suggest melanoma in sample was reported.  Spoke with Kelsey Andrews directly and she is doing well with the new nail starting the grow out uneventfully.  She will call if any concerns arise in the future.

## 2022-12-22 ENCOUNTER — Ambulatory Visit
Admission: RE | Admit: 2022-12-22 | Discharge: 2022-12-22 | Disposition: A | Discharge status: Home / Self Care | Payer: Medicare Other | Source: Ambulatory Visit | Attending: Internal Medicine | Admitting: Internal Medicine

## 2022-12-22 DIAGNOSIS — I7121 Aneurysm of the ascending aorta, without rupture: Secondary | ICD-10-CM | POA: Diagnosis not present

## 2022-12-22 DIAGNOSIS — I77810 Thoracic aortic ectasia: Secondary | ICD-10-CM

## 2022-12-22 MED ORDER — IOPAMIDOL (ISOVUE-370) INJECTION 76%
75.0000 mL | Freq: Once | INTRAVENOUS | Status: AC | PRN
  Administered 2022-12-22: 75 mL via INTRAVENOUS

## 2023-01-25 DIAGNOSIS — E039 Hypothyroidism, unspecified: Secondary | ICD-10-CM | POA: Diagnosis not present

## 2023-01-25 DIAGNOSIS — E785 Hyperlipidemia, unspecified: Secondary | ICD-10-CM | POA: Diagnosis not present

## 2023-02-01 ENCOUNTER — Other Ambulatory Visit: Payer: Self-pay | Admitting: Internal Medicine

## 2023-02-01 DIAGNOSIS — Z1231 Encounter for screening mammogram for malignant neoplasm of breast: Secondary | ICD-10-CM

## 2023-02-11 DIAGNOSIS — M19011 Primary osteoarthritis, right shoulder: Secondary | ICD-10-CM | POA: Diagnosis not present

## 2023-03-08 ENCOUNTER — Ambulatory Visit
Admission: RE | Admit: 2023-03-08 | Discharge: 2023-03-08 | Disposition: A | Discharge status: Home / Self Care | Payer: Medicare Other | Source: Ambulatory Visit | Attending: Internal Medicine | Admitting: Internal Medicine

## 2023-03-08 DIAGNOSIS — Z1231 Encounter for screening mammogram for malignant neoplasm of breast: Secondary | ICD-10-CM | POA: Diagnosis not present

## 2023-04-26 DIAGNOSIS — D2261 Melanocytic nevi of right upper limb, including shoulder: Secondary | ICD-10-CM | POA: Diagnosis not present

## 2023-04-26 DIAGNOSIS — D225 Melanocytic nevi of trunk: Secondary | ICD-10-CM | POA: Diagnosis not present

## 2023-04-26 DIAGNOSIS — L821 Other seborrheic keratosis: Secondary | ICD-10-CM | POA: Diagnosis not present

## 2023-04-26 DIAGNOSIS — D2272 Melanocytic nevi of left lower limb, including hip: Secondary | ICD-10-CM | POA: Diagnosis not present

## 2023-04-26 DIAGNOSIS — L218 Other seborrheic dermatitis: Secondary | ICD-10-CM | POA: Diagnosis not present

## 2023-04-26 DIAGNOSIS — D2239 Melanocytic nevi of other parts of face: Secondary | ICD-10-CM | POA: Diagnosis not present

## 2023-04-26 DIAGNOSIS — L814 Other melanin hyperpigmentation: Secondary | ICD-10-CM | POA: Diagnosis not present

## 2023-04-26 DIAGNOSIS — D2262 Melanocytic nevi of left upper limb, including shoulder: Secondary | ICD-10-CM | POA: Diagnosis not present

## 2023-04-26 DIAGNOSIS — Z85820 Personal history of malignant melanoma of skin: Secondary | ICD-10-CM | POA: Diagnosis not present

## 2023-04-26 DIAGNOSIS — D1801 Hemangioma of skin and subcutaneous tissue: Secondary | ICD-10-CM | POA: Diagnosis not present

## 2023-05-16 DIAGNOSIS — H04123 Dry eye syndrome of bilateral lacrimal glands: Secondary | ICD-10-CM | POA: Diagnosis not present

## 2023-06-10 DIAGNOSIS — M19011 Primary osteoarthritis, right shoulder: Secondary | ICD-10-CM | POA: Diagnosis not present

## 2023-06-17 DIAGNOSIS — S46811A Strain of other muscles, fascia and tendons at shoulder and upper arm level, right arm, initial encounter: Secondary | ICD-10-CM | POA: Diagnosis not present

## 2023-06-17 DIAGNOSIS — M75121 Complete rotator cuff tear or rupture of right shoulder, not specified as traumatic: Secondary | ICD-10-CM | POA: Diagnosis not present

## 2023-06-17 DIAGNOSIS — M948X1 Other specified disorders of cartilage, shoulder: Secondary | ICD-10-CM | POA: Diagnosis not present

## 2023-06-17 DIAGNOSIS — M19011 Primary osteoarthritis, right shoulder: Secondary | ICD-10-CM | POA: Diagnosis not present

## 2023-06-17 DIAGNOSIS — M25511 Pain in right shoulder: Secondary | ICD-10-CM | POA: Diagnosis not present

## 2023-07-08 DIAGNOSIS — M19011 Primary osteoarthritis, right shoulder: Secondary | ICD-10-CM | POA: Diagnosis not present

## 2023-07-19 DIAGNOSIS — Z01419 Encounter for gynecological examination (general) (routine) without abnormal findings: Secondary | ICD-10-CM | POA: Diagnosis not present

## 2023-07-19 DIAGNOSIS — C50919 Malignant neoplasm of unspecified site of unspecified female breast: Secondary | ICD-10-CM | POA: Diagnosis not present

## 2023-07-19 DIAGNOSIS — M3500 Sicca syndrome, unspecified: Secondary | ICD-10-CM | POA: Diagnosis not present

## 2023-07-19 DIAGNOSIS — N390 Urinary tract infection, site not specified: Secondary | ICD-10-CM | POA: Diagnosis not present

## 2023-07-19 DIAGNOSIS — N5089 Other specified disorders of the male genital organs: Secondary | ICD-10-CM | POA: Diagnosis not present

## 2023-07-19 DIAGNOSIS — N361 Urethral diverticulum: Secondary | ICD-10-CM | POA: Diagnosis not present

## 2023-07-26 DIAGNOSIS — M19011 Primary osteoarthritis, right shoulder: Secondary | ICD-10-CM | POA: Diagnosis not present

## 2023-08-05 ENCOUNTER — Other Ambulatory Visit: Payer: Self-pay | Admitting: Orthopaedic Surgery

## 2023-08-05 DIAGNOSIS — G8929 Other chronic pain: Secondary | ICD-10-CM

## 2023-08-08 DIAGNOSIS — M25511 Pain in right shoulder: Secondary | ICD-10-CM | POA: Diagnosis not present

## 2023-08-08 DIAGNOSIS — M19011 Primary osteoarthritis, right shoulder: Secondary | ICD-10-CM | POA: Diagnosis not present

## 2023-08-24 DIAGNOSIS — M19041 Primary osteoarthritis, right hand: Secondary | ICD-10-CM | POA: Diagnosis not present

## 2023-08-24 DIAGNOSIS — M6740 Ganglion, unspecified site: Secondary | ICD-10-CM | POA: Diagnosis not present

## 2023-08-30 ENCOUNTER — Ambulatory Visit
Admission: RE | Admit: 2023-08-30 | Discharge: 2023-08-30 | Disposition: A | Discharge status: Home / Self Care | Payer: Medicare Other | Source: Ambulatory Visit | Attending: Orthopaedic Surgery | Admitting: Orthopaedic Surgery

## 2023-08-30 DIAGNOSIS — M19011 Primary osteoarthritis, right shoulder: Secondary | ICD-10-CM | POA: Diagnosis not present

## 2023-08-30 DIAGNOSIS — M25511 Pain in right shoulder: Secondary | ICD-10-CM | POA: Diagnosis not present

## 2023-08-30 DIAGNOSIS — G8929 Other chronic pain: Secondary | ICD-10-CM

## 2023-09-14 DIAGNOSIS — M75101 Unspecified rotator cuff tear or rupture of right shoulder, not specified as traumatic: Secondary | ICD-10-CM | POA: Diagnosis not present

## 2023-09-14 DIAGNOSIS — G8918 Other acute postprocedural pain: Secondary | ICD-10-CM | POA: Diagnosis not present

## 2023-09-14 DIAGNOSIS — M19011 Primary osteoarthritis, right shoulder: Secondary | ICD-10-CM | POA: Diagnosis not present

## 2023-09-14 DIAGNOSIS — M25711 Osteophyte, right shoulder: Secondary | ICD-10-CM | POA: Diagnosis not present

## 2023-09-16 DIAGNOSIS — Z96611 Presence of right artificial shoulder joint: Secondary | ICD-10-CM | POA: Diagnosis not present

## 2023-09-16 DIAGNOSIS — M25611 Stiffness of right shoulder, not elsewhere classified: Secondary | ICD-10-CM | POA: Diagnosis not present

## 2023-09-16 DIAGNOSIS — M62511 Muscle wasting and atrophy, not elsewhere classified, right shoulder: Secondary | ICD-10-CM | POA: Diagnosis not present

## 2023-09-16 DIAGNOSIS — M25511 Pain in right shoulder: Secondary | ICD-10-CM | POA: Diagnosis not present

## 2023-09-22 DIAGNOSIS — M62511 Muscle wasting and atrophy, not elsewhere classified, right shoulder: Secondary | ICD-10-CM | POA: Diagnosis not present

## 2023-09-22 DIAGNOSIS — M25611 Stiffness of right shoulder, not elsewhere classified: Secondary | ICD-10-CM | POA: Diagnosis not present

## 2023-09-22 DIAGNOSIS — Z96611 Presence of right artificial shoulder joint: Secondary | ICD-10-CM | POA: Diagnosis not present

## 2023-09-22 DIAGNOSIS — M25511 Pain in right shoulder: Secondary | ICD-10-CM | POA: Diagnosis not present

## 2023-09-23 DIAGNOSIS — M19011 Primary osteoarthritis, right shoulder: Secondary | ICD-10-CM | POA: Diagnosis not present

## 2023-09-26 DIAGNOSIS — M62511 Muscle wasting and atrophy, not elsewhere classified, right shoulder: Secondary | ICD-10-CM | POA: Diagnosis not present

## 2023-09-26 DIAGNOSIS — M25511 Pain in right shoulder: Secondary | ICD-10-CM | POA: Diagnosis not present

## 2023-09-26 DIAGNOSIS — M25611 Stiffness of right shoulder, not elsewhere classified: Secondary | ICD-10-CM | POA: Diagnosis not present

## 2023-09-26 DIAGNOSIS — Z96611 Presence of right artificial shoulder joint: Secondary | ICD-10-CM | POA: Diagnosis not present

## 2023-10-05 DIAGNOSIS — Z96611 Presence of right artificial shoulder joint: Secondary | ICD-10-CM | POA: Diagnosis not present

## 2023-10-05 DIAGNOSIS — M25511 Pain in right shoulder: Secondary | ICD-10-CM | POA: Diagnosis not present

## 2023-10-05 DIAGNOSIS — M62511 Muscle wasting and atrophy, not elsewhere classified, right shoulder: Secondary | ICD-10-CM | POA: Diagnosis not present

## 2023-10-05 DIAGNOSIS — M25611 Stiffness of right shoulder, not elsewhere classified: Secondary | ICD-10-CM | POA: Diagnosis not present

## 2023-10-07 DIAGNOSIS — M62511 Muscle wasting and atrophy, not elsewhere classified, right shoulder: Secondary | ICD-10-CM | POA: Diagnosis not present

## 2023-10-07 DIAGNOSIS — Z96611 Presence of right artificial shoulder joint: Secondary | ICD-10-CM | POA: Diagnosis not present

## 2023-10-07 DIAGNOSIS — M25611 Stiffness of right shoulder, not elsewhere classified: Secondary | ICD-10-CM | POA: Diagnosis not present

## 2023-10-07 DIAGNOSIS — M25511 Pain in right shoulder: Secondary | ICD-10-CM | POA: Diagnosis not present

## 2023-10-10 DIAGNOSIS — M25511 Pain in right shoulder: Secondary | ICD-10-CM | POA: Diagnosis not present

## 2023-10-10 DIAGNOSIS — Z96611 Presence of right artificial shoulder joint: Secondary | ICD-10-CM | POA: Diagnosis not present

## 2023-10-10 DIAGNOSIS — M62511 Muscle wasting and atrophy, not elsewhere classified, right shoulder: Secondary | ICD-10-CM | POA: Diagnosis not present

## 2023-10-10 DIAGNOSIS — M25611 Stiffness of right shoulder, not elsewhere classified: Secondary | ICD-10-CM | POA: Diagnosis not present

## 2023-10-13 DIAGNOSIS — Z96611 Presence of right artificial shoulder joint: Secondary | ICD-10-CM | POA: Diagnosis not present

## 2023-10-13 DIAGNOSIS — M62511 Muscle wasting and atrophy, not elsewhere classified, right shoulder: Secondary | ICD-10-CM | POA: Diagnosis not present

## 2023-10-13 DIAGNOSIS — M25511 Pain in right shoulder: Secondary | ICD-10-CM | POA: Diagnosis not present

## 2023-10-13 DIAGNOSIS — M25611 Stiffness of right shoulder, not elsewhere classified: Secondary | ICD-10-CM | POA: Diagnosis not present

## 2023-10-18 DIAGNOSIS — M19011 Primary osteoarthritis, right shoulder: Secondary | ICD-10-CM | POA: Diagnosis not present

## 2023-10-19 DIAGNOSIS — M25511 Pain in right shoulder: Secondary | ICD-10-CM | POA: Diagnosis not present

## 2023-10-19 DIAGNOSIS — M25611 Stiffness of right shoulder, not elsewhere classified: Secondary | ICD-10-CM | POA: Diagnosis not present

## 2023-10-19 DIAGNOSIS — Z96611 Presence of right artificial shoulder joint: Secondary | ICD-10-CM | POA: Diagnosis not present

## 2023-10-19 DIAGNOSIS — M62511 Muscle wasting and atrophy, not elsewhere classified, right shoulder: Secondary | ICD-10-CM | POA: Diagnosis not present

## 2023-10-21 DIAGNOSIS — Z96611 Presence of right artificial shoulder joint: Secondary | ICD-10-CM | POA: Diagnosis not present

## 2023-10-21 DIAGNOSIS — M62511 Muscle wasting and atrophy, not elsewhere classified, right shoulder: Secondary | ICD-10-CM | POA: Diagnosis not present

## 2023-10-21 DIAGNOSIS — M25611 Stiffness of right shoulder, not elsewhere classified: Secondary | ICD-10-CM | POA: Diagnosis not present

## 2023-10-21 DIAGNOSIS — M25511 Pain in right shoulder: Secondary | ICD-10-CM | POA: Diagnosis not present

## 2023-10-26 DIAGNOSIS — M62511 Muscle wasting and atrophy, not elsewhere classified, right shoulder: Secondary | ICD-10-CM | POA: Diagnosis not present

## 2023-10-26 DIAGNOSIS — Z96611 Presence of right artificial shoulder joint: Secondary | ICD-10-CM | POA: Diagnosis not present

## 2023-10-26 DIAGNOSIS — M25511 Pain in right shoulder: Secondary | ICD-10-CM | POA: Diagnosis not present

## 2023-10-26 DIAGNOSIS — M25611 Stiffness of right shoulder, not elsewhere classified: Secondary | ICD-10-CM | POA: Diagnosis not present

## 2023-11-01 DIAGNOSIS — Z96611 Presence of right artificial shoulder joint: Secondary | ICD-10-CM | POA: Diagnosis not present

## 2023-11-01 DIAGNOSIS — M25511 Pain in right shoulder: Secondary | ICD-10-CM | POA: Diagnosis not present

## 2023-11-01 DIAGNOSIS — M62511 Muscle wasting and atrophy, not elsewhere classified, right shoulder: Secondary | ICD-10-CM | POA: Diagnosis not present

## 2023-11-01 DIAGNOSIS — M25611 Stiffness of right shoulder, not elsewhere classified: Secondary | ICD-10-CM | POA: Diagnosis not present

## 2023-11-04 DIAGNOSIS — M25511 Pain in right shoulder: Secondary | ICD-10-CM | POA: Diagnosis not present

## 2023-11-04 DIAGNOSIS — M62511 Muscle wasting and atrophy, not elsewhere classified, right shoulder: Secondary | ICD-10-CM | POA: Diagnosis not present

## 2023-11-04 DIAGNOSIS — M25611 Stiffness of right shoulder, not elsewhere classified: Secondary | ICD-10-CM | POA: Diagnosis not present

## 2023-11-04 DIAGNOSIS — Z96611 Presence of right artificial shoulder joint: Secondary | ICD-10-CM | POA: Diagnosis not present

## 2023-12-20 DIAGNOSIS — M19011 Primary osteoarthritis, right shoulder: Secondary | ICD-10-CM | POA: Diagnosis not present

## 2024-02-07 ENCOUNTER — Other Ambulatory Visit: Payer: Self-pay | Admitting: Internal Medicine

## 2024-02-07 DIAGNOSIS — H903 Sensorineural hearing loss, bilateral: Secondary | ICD-10-CM | POA: Diagnosis not present

## 2024-02-07 DIAGNOSIS — H60543 Acute eczematoid otitis externa, bilateral: Secondary | ICD-10-CM | POA: Diagnosis not present

## 2024-02-07 DIAGNOSIS — H9313 Tinnitus, bilateral: Secondary | ICD-10-CM | POA: Diagnosis not present

## 2024-02-07 DIAGNOSIS — Z1231 Encounter for screening mammogram for malignant neoplasm of breast: Secondary | ICD-10-CM

## 2024-02-16 DIAGNOSIS — Z1212 Encounter for screening for malignant neoplasm of rectum: Secondary | ICD-10-CM | POA: Diagnosis not present

## 2024-02-16 DIAGNOSIS — E785 Hyperlipidemia, unspecified: Secondary | ICD-10-CM | POA: Diagnosis not present

## 2024-02-16 DIAGNOSIS — M8580 Other specified disorders of bone density and structure, unspecified site: Secondary | ICD-10-CM | POA: Diagnosis not present

## 2024-02-16 DIAGNOSIS — E039 Hypothyroidism, unspecified: Secondary | ICD-10-CM | POA: Diagnosis not present

## 2024-02-16 DIAGNOSIS — E7849 Other hyperlipidemia: Secondary | ICD-10-CM | POA: Diagnosis not present

## 2024-02-23 DIAGNOSIS — E039 Hypothyroidism, unspecified: Secondary | ICD-10-CM | POA: Diagnosis not present

## 2024-02-23 DIAGNOSIS — E785 Hyperlipidemia, unspecified: Secondary | ICD-10-CM | POA: Diagnosis not present

## 2024-02-23 DIAGNOSIS — Z0000 Encounter for general adult medical examination without abnormal findings: Secondary | ICD-10-CM | POA: Diagnosis not present

## 2024-02-23 DIAGNOSIS — I444 Left anterior fascicular block: Secondary | ICD-10-CM | POA: Diagnosis not present

## 2024-02-23 DIAGNOSIS — Z853 Personal history of malignant neoplasm of breast: Secondary | ICD-10-CM | POA: Diagnosis not present

## 2024-02-23 DIAGNOSIS — R030 Elevated blood-pressure reading, without diagnosis of hypertension: Secondary | ICD-10-CM | POA: Diagnosis not present

## 2024-02-23 DIAGNOSIS — R82998 Other abnormal findings in urine: Secondary | ICD-10-CM | POA: Diagnosis not present

## 2024-02-23 DIAGNOSIS — M3500 Sicca syndrome, unspecified: Secondary | ICD-10-CM | POA: Diagnosis not present

## 2024-02-23 DIAGNOSIS — E669 Obesity, unspecified: Secondary | ICD-10-CM | POA: Diagnosis not present

## 2024-02-23 DIAGNOSIS — Z1331 Encounter for screening for depression: Secondary | ICD-10-CM | POA: Diagnosis not present

## 2024-02-23 DIAGNOSIS — Z23 Encounter for immunization: Secondary | ICD-10-CM | POA: Diagnosis not present

## 2024-02-23 DIAGNOSIS — E210 Primary hyperparathyroidism: Secondary | ICD-10-CM | POA: Diagnosis not present

## 2024-02-23 DIAGNOSIS — I77810 Thoracic aortic ectasia: Secondary | ICD-10-CM | POA: Diagnosis not present

## 2024-03-08 ENCOUNTER — Ambulatory Visit
Admission: RE | Admit: 2024-03-08 | Discharge: 2024-03-08 | Disposition: A | Discharge status: Home / Self Care | Source: Ambulatory Visit | Attending: Internal Medicine | Admitting: Internal Medicine

## 2024-03-08 DIAGNOSIS — Z1231 Encounter for screening mammogram for malignant neoplasm of breast: Secondary | ICD-10-CM | POA: Diagnosis not present

## 2024-04-23 DIAGNOSIS — E039 Hypothyroidism, unspecified: Secondary | ICD-10-CM | POA: Diagnosis not present

## 2024-04-30 DIAGNOSIS — D2272 Melanocytic nevi of left lower limb, including hip: Secondary | ICD-10-CM | POA: Diagnosis not present

## 2024-04-30 DIAGNOSIS — D225 Melanocytic nevi of trunk: Secondary | ICD-10-CM | POA: Diagnosis not present

## 2024-04-30 DIAGNOSIS — D2271 Melanocytic nevi of right lower limb, including hip: Secondary | ICD-10-CM | POA: Diagnosis not present

## 2024-04-30 DIAGNOSIS — D485 Neoplasm of uncertain behavior of skin: Secondary | ICD-10-CM | POA: Diagnosis not present

## 2024-04-30 DIAGNOSIS — L821 Other seborrheic keratosis: Secondary | ICD-10-CM | POA: Diagnosis not present

## 2024-04-30 DIAGNOSIS — D1801 Hemangioma of skin and subcutaneous tissue: Secondary | ICD-10-CM | POA: Diagnosis not present

## 2024-04-30 DIAGNOSIS — L820 Inflamed seborrheic keratosis: Secondary | ICD-10-CM | POA: Diagnosis not present

## 2024-04-30 DIAGNOSIS — Z85820 Personal history of malignant melanoma of skin: Secondary | ICD-10-CM | POA: Diagnosis not present

## 2024-04-30 DIAGNOSIS — L570 Actinic keratosis: Secondary | ICD-10-CM | POA: Diagnosis not present

## 2024-04-30 DIAGNOSIS — D224 Melanocytic nevi of scalp and neck: Secondary | ICD-10-CM | POA: Diagnosis not present

## 2024-04-30 DIAGNOSIS — L430 Hypertrophic lichen planus: Secondary | ICD-10-CM | POA: Diagnosis not present

## 2024-05-21 DIAGNOSIS — H43392 Other vitreous opacities, left eye: Secondary | ICD-10-CM | POA: Diagnosis not present
